# Patient Record
Sex: Female | Born: 1980 | Race: White | Hispanic: No | Smoking: Current every day smoker
Health system: Southern US, Community
[De-identification: ages and names within clinical notes are randomized; demographics above are authoritative.]

## PROBLEM LIST (undated history)

## (undated) DIAGNOSIS — B029 Zoster without complications: Secondary | ICD-10-CM

## (undated) DIAGNOSIS — M779 Enthesopathy, unspecified: Secondary | ICD-10-CM

## (undated) HISTORY — PX: CHOLECYSTECTOMY: SHX55

## (undated) HISTORY — PX: KNEE SURGERY: SHX244

## (undated) HISTORY — PX: MULTIPLE TOOTH EXTRACTIONS: SHX2053

---

## 2002-08-09 ENCOUNTER — Emergency Department (HOSPITAL_COMMUNITY): Admission: EM | Admit: 2002-08-09 | Discharge: 2002-08-09 | Payer: Self-pay

## 2004-12-28 ENCOUNTER — Emergency Department (HOSPITAL_COMMUNITY): Admission: EM | Admit: 2004-12-28 | Discharge: 2004-12-28 | Payer: Self-pay | Admitting: Emergency Medicine

## 2005-05-25 ENCOUNTER — Emergency Department (HOSPITAL_COMMUNITY): Admission: EM | Admit: 2005-05-25 | Discharge: 2005-05-25 | Payer: Self-pay | Admitting: Emergency Medicine

## 2006-10-16 ENCOUNTER — Emergency Department (HOSPITAL_COMMUNITY): Admission: EM | Admit: 2006-10-16 | Discharge: 2006-10-17 | Payer: Self-pay | Admitting: Emergency Medicine

## 2007-03-22 ENCOUNTER — Emergency Department (HOSPITAL_COMMUNITY): Admission: EM | Admit: 2007-03-22 | Discharge: 2007-03-22 | Payer: Self-pay | Admitting: Emergency Medicine

## 2007-03-30 ENCOUNTER — Emergency Department (HOSPITAL_COMMUNITY): Admission: EM | Admit: 2007-03-30 | Discharge: 2007-03-30 | Payer: Self-pay | Admitting: Emergency Medicine

## 2007-05-02 ENCOUNTER — Emergency Department (HOSPITAL_COMMUNITY): Admission: EM | Admit: 2007-05-02 | Discharge: 2007-05-02 | Payer: Self-pay | Admitting: *Deleted

## 2007-07-04 ENCOUNTER — Emergency Department (HOSPITAL_COMMUNITY): Admission: EM | Admit: 2007-07-04 | Discharge: 2007-07-04 | Payer: Self-pay | Admitting: Family Medicine

## 2007-07-06 ENCOUNTER — Emergency Department (HOSPITAL_COMMUNITY): Admission: EM | Admit: 2007-07-06 | Discharge: 2007-07-06 | Payer: Self-pay | Admitting: Emergency Medicine

## 2007-08-02 ENCOUNTER — Emergency Department (HOSPITAL_COMMUNITY): Admission: EM | Admit: 2007-08-02 | Discharge: 2007-08-02 | Payer: Self-pay | Admitting: Emergency Medicine

## 2007-08-15 ENCOUNTER — Emergency Department (HOSPITAL_COMMUNITY): Admission: EM | Admit: 2007-08-15 | Discharge: 2007-08-15 | Payer: Self-pay | Admitting: Emergency Medicine

## 2007-10-08 ENCOUNTER — Emergency Department (HOSPITAL_COMMUNITY): Admission: EM | Admit: 2007-10-08 | Discharge: 2007-10-09 | Payer: Self-pay | Admitting: Emergency Medicine

## 2007-12-22 ENCOUNTER — Emergency Department (HOSPITAL_COMMUNITY): Admission: EM | Admit: 2007-12-22 | Discharge: 2007-12-22 | Payer: Self-pay | Admitting: Family Medicine

## 2008-03-19 ENCOUNTER — Emergency Department (HOSPITAL_COMMUNITY): Admission: EM | Admit: 2008-03-19 | Discharge: 2008-03-19 | Payer: Self-pay | Admitting: Emergency Medicine

## 2008-06-02 ENCOUNTER — Emergency Department (HOSPITAL_COMMUNITY): Admission: EM | Admit: 2008-06-02 | Discharge: 2008-06-02 | Payer: Self-pay | Admitting: Emergency Medicine

## 2008-08-31 ENCOUNTER — Emergency Department (HOSPITAL_COMMUNITY): Admission: EM | Admit: 2008-08-31 | Discharge: 2008-08-31 | Payer: Self-pay | Admitting: Family Medicine

## 2008-10-31 ENCOUNTER — Inpatient Hospital Stay (HOSPITAL_COMMUNITY): Admission: AD | Admit: 2008-10-31 | Discharge: 2008-10-31 | Payer: Self-pay | Admitting: Obstetrics & Gynecology

## 2008-12-11 ENCOUNTER — Inpatient Hospital Stay (HOSPITAL_COMMUNITY): Admission: AD | Admit: 2008-12-11 | Discharge: 2008-12-11 | Payer: Self-pay | Admitting: Obstetrics and Gynecology

## 2008-12-14 ENCOUNTER — Inpatient Hospital Stay (HOSPITAL_COMMUNITY): Admission: AD | Admit: 2008-12-14 | Discharge: 2008-12-15 | Payer: Self-pay | Admitting: Obstetrics and Gynecology

## 2009-01-17 ENCOUNTER — Inpatient Hospital Stay (HOSPITAL_COMMUNITY): Admission: AD | Admit: 2009-01-17 | Discharge: 2009-01-17 | Payer: Self-pay | Admitting: Obstetrics and Gynecology

## 2009-03-04 ENCOUNTER — Inpatient Hospital Stay (HOSPITAL_COMMUNITY): Admission: AD | Admit: 2009-03-04 | Discharge: 2009-03-04 | Payer: Self-pay | Admitting: Obstetrics and Gynecology

## 2009-05-16 ENCOUNTER — Emergency Department (HOSPITAL_COMMUNITY): Admission: EM | Admit: 2009-05-16 | Discharge: 2009-05-16 | Payer: Self-pay | Admitting: Emergency Medicine

## 2009-05-19 ENCOUNTER — Emergency Department (HOSPITAL_COMMUNITY): Admission: EM | Admit: 2009-05-19 | Discharge: 2009-05-19 | Payer: Self-pay | Admitting: Emergency Medicine

## 2009-05-31 ENCOUNTER — Emergency Department (HOSPITAL_COMMUNITY): Admission: EM | Admit: 2009-05-31 | Discharge: 2009-05-31 | Payer: Self-pay | Admitting: Emergency Medicine

## 2009-08-16 ENCOUNTER — Inpatient Hospital Stay (HOSPITAL_COMMUNITY): Admission: AD | Admit: 2009-08-16 | Discharge: 2009-08-16 | Payer: Self-pay | Admitting: Obstetrics and Gynecology

## 2010-09-03 ENCOUNTER — Inpatient Hospital Stay (HOSPITAL_COMMUNITY)
Admission: RE | Admit: 2010-09-03 | Discharge: 2010-09-03 | Disposition: A | Discharge status: Home / Self Care | Payer: Self-pay | Source: Ambulatory Visit | Attending: Family Medicine | Admitting: Family Medicine

## 2010-09-29 LAB — URINALYSIS, ROUTINE W REFLEX MICROSCOPIC
Glucose, UA: NEGATIVE mg/dL
Hgb urine dipstick: NEGATIVE
Ketones, ur: NEGATIVE mg/dL
Protein, ur: NEGATIVE mg/dL
Specific Gravity, Urine: 1.02 (ref 1.005–1.030)

## 2010-09-29 LAB — WET PREP, GENITAL
Clue Cells Wet Prep HPF POC: NONE SEEN
Trich, Wet Prep: NONE SEEN
Yeast Wet Prep HPF POC: NONE SEEN

## 2010-09-29 LAB — CBC
HCT: 33.7 % — ABNORMAL LOW (ref 36.0–46.0)
RDW: 15.1 % (ref 11.5–15.5)
WBC: 7.6 10*3/uL (ref 4.0–10.5)

## 2010-09-29 LAB — GC/CHLAMYDIA PROBE AMP, GENITAL
Chlamydia, DNA Probe: NEGATIVE
GC Probe Amp, Genital: NEGATIVE

## 2010-09-29 LAB — POCT PREGNANCY, URINE: Preg Test, Ur: NEGATIVE

## 2010-10-14 ENCOUNTER — Inpatient Hospital Stay (HOSPITAL_COMMUNITY)
Admission: RE | Admit: 2010-10-14 | Discharge: 2010-10-14 | Disposition: A | Discharge status: Home / Self Care | Payer: Self-pay | Source: Ambulatory Visit | Attending: Obstetrics & Gynecology | Admitting: Obstetrics & Gynecology

## 2010-10-14 DIAGNOSIS — R109 Unspecified abdominal pain: Secondary | ICD-10-CM | POA: Insufficient documentation

## 2010-10-14 DIAGNOSIS — Z3202 Encounter for pregnancy test, result negative: Secondary | ICD-10-CM | POA: Insufficient documentation

## 2010-10-14 LAB — POCT PREGNANCY, URINE: Preg Test, Ur: NEGATIVE

## 2010-10-16 LAB — URINALYSIS, ROUTINE W REFLEX MICROSCOPIC
Nitrite: NEGATIVE
Protein, ur: NEGATIVE mg/dL

## 2010-10-16 LAB — DIFFERENTIAL
Basophils Absolute: 0 10*3/uL (ref 0.0–0.1)
Basophils Relative: 0 % (ref 0–1)
Eosinophils Relative: 2 % (ref 0–5)
Neutrophils Relative %: 56 % (ref 43–77)

## 2010-10-16 LAB — COMPREHENSIVE METABOLIC PANEL
AST: 37 U/L (ref 0–37)
Albumin: 3.9 g/dL (ref 3.5–5.2)
BUN: 10 mg/dL (ref 6–23)
CO2: 25 mEq/L (ref 19–32)
Calcium: 9.4 mg/dL (ref 8.4–10.5)
Creatinine, Ser: 0.7 mg/dL (ref 0.4–1.2)
Glucose, Bld: 87 mg/dL (ref 70–99)
Sodium: 134 mEq/L — ABNORMAL LOW (ref 135–145)
Total Bilirubin: 0.5 mg/dL (ref 0.3–1.2)

## 2010-10-16 LAB — CBC
Hemoglobin: 11.1 g/dL — ABNORMAL LOW (ref 12.0–15.0)
MCHC: 33.2 g/dL (ref 30.0–36.0)
RBC: 3.99 MIL/uL (ref 3.87–5.11)
RDW: 14.1 % (ref 11.5–15.5)
WBC: 7.9 10*3/uL (ref 4.0–10.5)

## 2010-10-16 LAB — URINE CULTURE: Colony Count: 9000

## 2010-10-17 LAB — URINALYSIS, ROUTINE W REFLEX MICROSCOPIC
Bilirubin Urine: NEGATIVE
Glucose, UA: NEGATIVE mg/dL
Ketones, ur: NEGATIVE mg/dL
Leukocytes, UA: NEGATIVE
Nitrite: NEGATIVE
Protein, ur: NEGATIVE mg/dL
Urobilinogen, UA: 0.2 mg/dL (ref 0.0–1.0)

## 2010-10-17 LAB — URINE MICROSCOPIC-ADD ON

## 2010-10-18 LAB — CBC
Hemoglobin: 11.6 g/dL — ABNORMAL LOW (ref 12.0–15.0)
MCHC: 34.5 g/dL (ref 30.0–36.0)
MCV: 87 fL (ref 78.0–100.0)
Platelets: 192 10*3/uL (ref 150–400)
RBC: 3.85 MIL/uL — ABNORMAL LOW (ref 3.87–5.11)
RDW: 15 % (ref 11.5–15.5)

## 2010-10-18 LAB — HCG, QUANTITATIVE, PREGNANCY: hCG, Beta Chain, Quant, S: 4431 m[IU]/mL — ABNORMAL HIGH (ref <5)

## 2010-10-18 LAB — RH IMMUNE GLOBULIN WORKUP (NOT WOMEN'S HOSP): ABO/RH(D): A NEG

## 2010-10-20 LAB — WET PREP, GENITAL
Clue Cells Wet Prep HPF POC: NONE SEEN
Trich, Wet Prep: NONE SEEN

## 2010-10-20 LAB — URINALYSIS, ROUTINE W REFLEX MICROSCOPIC
Glucose, UA: NEGATIVE mg/dL
Nitrite: NEGATIVE
Specific Gravity, Urine: 1.02 (ref 1.005–1.030)
pH: 6 (ref 5.0–8.0)

## 2010-10-20 LAB — HCG, QUANTITATIVE, PREGNANCY: hCG, Beta Chain, Quant, S: 11001 m[IU]/mL — ABNORMAL HIGH (ref <5)

## 2010-10-20 LAB — GC/CHLAMYDIA PROBE AMP, GENITAL
Chlamydia, DNA Probe: NEGATIVE
GC Probe Amp, Genital: NEGATIVE

## 2010-10-20 LAB — CBC
Hemoglobin: 11.9 g/dL — ABNORMAL LOW (ref 12.0–15.0)
RBC: 4.08 MIL/uL (ref 3.87–5.11)

## 2010-10-20 LAB — ABO/RH: ABO/RH(D): A NEG

## 2010-11-29 ENCOUNTER — Inpatient Hospital Stay (INDEPENDENT_AMBULATORY_CARE_PROVIDER_SITE_OTHER)
Admission: RE | Admit: 2010-11-29 | Discharge: 2010-11-29 | Disposition: A | Discharge status: Home / Self Care | Payer: Self-pay | Source: Ambulatory Visit | Attending: Family Medicine | Admitting: Family Medicine

## 2010-11-29 DIAGNOSIS — K047 Periapical abscess without sinus: Secondary | ICD-10-CM

## 2011-01-29 ENCOUNTER — Inpatient Hospital Stay (INDEPENDENT_AMBULATORY_CARE_PROVIDER_SITE_OTHER)
Admission: RE | Admit: 2011-01-29 | Discharge: 2011-01-29 | Disposition: A | Discharge status: Home / Self Care | Payer: Self-pay | Source: Ambulatory Visit | Attending: Family Medicine | Admitting: Family Medicine

## 2011-01-29 DIAGNOSIS — J069 Acute upper respiratory infection, unspecified: Secondary | ICD-10-CM

## 2011-04-04 LAB — CBC
HCT: 39.1
Hemoglobin: 12.6
MCV: 82.6
RBC: 4.74
WBC: 10.1

## 2011-04-04 LAB — URINALYSIS, ROUTINE W REFLEX MICROSCOPIC
Bilirubin Urine: NEGATIVE
Glucose, UA: NEGATIVE
Hgb urine dipstick: NEGATIVE
Protein, ur: NEGATIVE
Specific Gravity, Urine: 1.021
Urobilinogen, UA: 1

## 2011-04-04 LAB — DIFFERENTIAL
Eosinophils Absolute: 0.2
Eosinophils Relative: 2
Lymphocytes Relative: 35
Lymphs Abs: 3.5
Monocytes Absolute: 0.6
Monocytes Relative: 6

## 2011-04-04 LAB — POCT PREGNANCY, URINE
Operator id: 244461
Preg Test, Ur: NEGATIVE

## 2011-04-04 LAB — BASIC METABOLIC PANEL
BUN: 13
Chloride: 106
GFR calc non Af Amer: >60
Potassium: 4.1
Sodium: 137

## 2011-04-07 LAB — POCT URINALYSIS DIP (DEVICE)
Ketones, ur: NEGATIVE
Operator id: 282151
Protein, ur: NEGATIVE
Specific Gravity, Urine: >=1.03
Urobilinogen, UA: 0.2
pH: 6

## 2011-04-29 ENCOUNTER — Emergency Department (HOSPITAL_COMMUNITY)
Admission: EM | Admit: 2011-04-29 | Discharge: 2011-04-29 | Disposition: A | Discharge status: Home / Self Care | Payer: Self-pay | Attending: Emergency Medicine | Admitting: Emergency Medicine

## 2011-04-29 ENCOUNTER — Emergency Department (HOSPITAL_COMMUNITY): Admission: EM | Admit: 2011-04-29 | Payer: Self-pay | Source: Home / Self Care

## 2011-04-29 DIAGNOSIS — K029 Dental caries, unspecified: Secondary | ICD-10-CM | POA: Insufficient documentation

## 2011-04-29 DIAGNOSIS — K047 Periapical abscess without sinus: Secondary | ICD-10-CM | POA: Insufficient documentation

## 2013-03-08 ENCOUNTER — Emergency Department (HOSPITAL_COMMUNITY)
Admission: EM | Admit: 2013-03-08 | Discharge: 2013-03-08 | Disposition: A | Discharge status: Home / Self Care | Payer: BC Managed Care – PPO | Attending: Emergency Medicine | Admitting: Emergency Medicine

## 2013-03-08 ENCOUNTER — Encounter (HOSPITAL_COMMUNITY): Payer: Self-pay | Admitting: Emergency Medicine

## 2013-03-08 DIAGNOSIS — J40 Bronchitis, not specified as acute or chronic: Secondary | ICD-10-CM | POA: Insufficient documentation

## 2013-03-08 DIAGNOSIS — F17200 Nicotine dependence, unspecified, uncomplicated: Secondary | ICD-10-CM | POA: Insufficient documentation

## 2013-03-08 DIAGNOSIS — J3489 Other specified disorders of nose and nasal sinuses: Secondary | ICD-10-CM | POA: Insufficient documentation

## 2013-03-08 DIAGNOSIS — R5381 Other malaise: Secondary | ICD-10-CM | POA: Insufficient documentation

## 2013-03-08 DIAGNOSIS — R509 Fever, unspecified: Secondary | ICD-10-CM | POA: Insufficient documentation

## 2013-03-08 DIAGNOSIS — J029 Acute pharyngitis, unspecified: Secondary | ICD-10-CM | POA: Insufficient documentation

## 2013-03-08 DIAGNOSIS — Z880 Allergy status to penicillin: Secondary | ICD-10-CM | POA: Insufficient documentation

## 2013-03-08 DIAGNOSIS — B349 Viral infection, unspecified: Secondary | ICD-10-CM

## 2013-03-08 DIAGNOSIS — B9789 Other viral agents as the cause of diseases classified elsewhere: Secondary | ICD-10-CM | POA: Insufficient documentation

## 2013-03-08 DIAGNOSIS — IMO0001 Reserved for inherently not codable concepts without codable children: Secondary | ICD-10-CM | POA: Insufficient documentation

## 2013-03-08 MED ORDER — BENZONATATE 100 MG PO CAPS: 100.0000 mg | ORAL_CAPSULE | Freq: Three times a day (TID) | ORAL | Status: DC

## 2013-03-08 MED ORDER — HYDROCOD POLST-CHLORPHEN POLST 10-8 MG/5ML PO LQCR: 5.0000 mL | Freq: Two times a day (BID) | ORAL | Status: DC

## 2013-03-08 NOTE — ED Notes (Signed)
Pt states she began to have a cough and sore throat since last night. Pt also states she has been feeling very warm.  Pt states she has green sputum. Pt states husband has had the same symptoms since yesterday. Denies sob

## 2013-03-08 NOTE — ED Provider Notes (Signed)
CSN: [id number]     Arrival date & time 03/08/13  1216 History   First MD Initiated Contact with Patient 03/08/13 1237     Chief Complaint  Patient presents with  . Cough  . Fever    HPI   Patient presents here with her husband both of the nail for a day or 2. Body aches dry cough nasal congestion. She's had a bit of a sore throat. No abdominal pelvic pain no GI symptoms. History reviewed. No pertinent past medical history. History reviewed. No pertinent past surgical history. History reviewed. No pertinent family history. History  Substance Use Topics  . Smoking status: Current Every Day Smoker  . Smokeless tobacco: Not on file  . Alcohol Use: Yes   OB History   Grav Para Term Preterm Abortions TAB SAB Ect Mult Living                 Review of Systems  Constitutional: Positive for fever and fatigue. Negative for chills, diaphoresis and appetite change.  HENT: Positive for congestion, rhinorrhea and sinus pressure. Negative for sore throat, mouth sores and trouble swallowing.   Eyes: Negative for visual disturbance.  Respiratory: Positive for cough. Negative for chest tightness, shortness of breath and wheezing.   Cardiovascular: Negative for chest pain.  Gastrointestinal: Negative for nausea, vomiting, abdominal pain, diarrhea and abdominal distention.  Endocrine: Negative for polydipsia, polyphagia and polyuria.  Genitourinary: Negative for dysuria, frequency and hematuria.  Musculoskeletal: Positive for myalgias. Negative for gait problem.  Skin: Negative for color change, pallor and rash.  Neurological: Negative for dizziness, syncope, light-headedness and headaches.  Hematological: Does not bruise/bleed easily.  Psychiatric/Behavioral: Negative for behavioral problems and confusion.    Allergies  Codeine and Penicillins  Home Medications   Current Outpatient Rx  Name  Route  Sig  Dispense  Refill  . acetaminophen (TYLENOL) 325 MG tablet   Oral   Take 650 mg by  mouth every 6 (six) hours as needed for pain.         Marland Kitchen aspirin-acetaminophen-caffeine (EXCEDRIN MIGRAINE) 250-250-65 MG per tablet   Oral   Take 2 tablets by mouth every 6 (six) hours as needed (migraine).         Marland Kitchen OVER THE COUNTER MEDICATION   Oral   Take 10 mLs by mouth 2 (two) times daily as needed (cough).         . benzonatate (TESSALON) 100 MG capsule   Oral   Take 1 capsule (100 mg total) by mouth every 8 (eight) hours.   21 capsule   0   . chlorpheniramine-HYDROcodone (TUSSIONEX PENNKINETIC ER) 10-8 MG/5ML LQCR   Oral   Take 5 mLs by mouth every 12 (twelve) hours.   50 mL   0    BP 105/67  Pulse 95  Temp(Src) 98 F (36.7 C) (Oral)  Resp 16  SpO2 99% Physical Exam  Constitutional: She is oriented to person, place, and time. She appears well-developed and well-nourished. No distress.  HENT:  Head: Normocephalic.  Right Ear: No hemotympanum.  Left Ear: No hemotympanum.  Mouth/Throat: No oropharyngeal exudate, posterior oropharyngeal edema, posterior oropharyngeal erythema or tonsillar abscesses.  Eyes: Conjunctivae are normal. Pupils are equal, round, and reactive to light. No scleral icterus.  Neck: Normal range of motion. Neck supple. No thyromegaly present.  Cardiovascular: Normal rate and regular rhythm.  Exam reveals no gallop and no friction rub.   No murmur heard. Pulmonary/Chest: Effort normal and breath sounds normal. No  respiratory distress. She has no decreased breath sounds. She has no wheezes. She has no rhonchi. She has no rales.  Abdominal: Soft. Bowel sounds are normal. She exhibits no distension. There is no tenderness. There is no rebound.  Musculoskeletal: Normal range of motion.  Neurological: She is alert and oriented to person, place, and time.  Skin: Skin is warm and dry. No rash noted.  Psychiatric: She has a normal mood and affect. Her behavior is normal.    ED Course  Procedures (including critical care time) Labs Review Labs  Reviewed - No data to display Imaging Review No results found.  MDM   1. Viral syndrome   2. Bronchitis    Clear lungs.  Not hypoxemic. Normal exam.    Claudean Kinds, MD 03/08/13 1300

## 2017-06-24 ENCOUNTER — Encounter: Payer: Self-pay | Admitting: Emergency Medicine

## 2017-06-24 ENCOUNTER — Emergency Department
Admission: EM | Admit: 2017-06-24 | Discharge: 2017-06-24 | Disposition: A | Discharge status: Home / Self Care | Payer: Self-pay | Attending: Emergency Medicine | Admitting: Emergency Medicine

## 2017-06-24 ENCOUNTER — Emergency Department: Payer: Self-pay

## 2017-06-24 DIAGNOSIS — F17200 Nicotine dependence, unspecified, uncomplicated: Secondary | ICD-10-CM | POA: Insufficient documentation

## 2017-06-24 DIAGNOSIS — Y929 Unspecified place or not applicable: Secondary | ICD-10-CM | POA: Insufficient documentation

## 2017-06-24 DIAGNOSIS — S0181XA Laceration without foreign body of other part of head, initial encounter: Secondary | ICD-10-CM | POA: Insufficient documentation

## 2017-06-24 DIAGNOSIS — W228XXA Striking against or struck by other objects, initial encounter: Secondary | ICD-10-CM | POA: Insufficient documentation

## 2017-06-24 DIAGNOSIS — Z9049 Acquired absence of other specified parts of digestive tract: Secondary | ICD-10-CM | POA: Insufficient documentation

## 2017-06-24 DIAGNOSIS — Y990 Civilian activity done for income or pay: Secondary | ICD-10-CM | POA: Insufficient documentation

## 2017-06-24 DIAGNOSIS — Y9389 Activity, other specified: Secondary | ICD-10-CM | POA: Insufficient documentation

## 2017-06-24 DIAGNOSIS — S0990XA Unspecified injury of head, initial encounter: Secondary | ICD-10-CM | POA: Insufficient documentation

## 2017-06-24 LAB — POCT PREGNANCY, URINE: PREG TEST UR: NEGATIVE

## 2017-06-24 MED ORDER — MELOXICAM 15 MG PO TABS: 15.0000 mg | ORAL_TABLET | Freq: Every day | ORAL | 0 refills | Status: DC

## 2017-06-24 NOTE — ED Notes (Signed)

## 2017-06-24 NOTE — ED Triage Notes (Signed)
Pt. is w/c but first nurse verified with place of employment that no testing is required.

## 2017-06-24 NOTE — ED Notes (Signed)
Spoke with [employer] Manager, [employer]ica Aziles [phone]) and no UDS  Required.

## 2017-06-24 NOTE — ED Provider Notes (Signed)
[facility] Emergency Department Provider Note  ____________________________________________   First MD Initiated Contact with Patient 06/24/17 2037     (approximate)  I have reviewed the triage vital signs and the nursing notes.   HISTORY  Chief Complaint Head Laceration    HPI Krystal Curry is a 36 y.o. female injury to her head, she states a metal pipe hit her in the head while she was hanging clothes on it, she knows that her head was bleeding and freaked out, her coworker states that she could not answer questions correctly and seemed to be scrambling her words a little bit directly after the incident, now she has a headache, the area of has stopped bleeding, she denies vomiting; her boyfriend states that he feels like she is been acting normal since they arrived in the ER  History reviewed. No pertinent past medical history.  There are no active problems to display for this patient.   Past Surgical History:  Procedure Laterality Date  . CHOLECYSTECTOMY    . KNEE SURGERY Left   . MULTIPLE TOOTH EXTRACTIONS Bilateral     Prior to Admission medications   Medication Sig Start Date End Date Taking? Authorizing Provider  meloxicam (MOBIC) 15 MG tablet Take 1 tablet (15 mg total) by mouth daily. 06/24/17 06/24/18  Faythe GheeFisher, Susan W, PA-C    Allergies Penicillins and Prednisone  No family history on file.  Social History Social History   Tobacco Use  . Smoking status: Current Every Day Smoker  . Smokeless tobacco: Never Used  Substance Use Topics  . Alcohol use: No    Frequency: Never  . Drug use: No    Review of Systems  Constitutional: No fever/chills, positive head injury Eyes: No visual changes. ENT: No sore throat. Respiratory: Denies cough Genitourinary: Negative for dysuria. Musculoskeletal: Negative for back pain. Skin: Positive for laceration.    ____________________________________________   PHYSICAL EXAM:  VITAL  SIGNS: ED Triage Vitals [06/24/17 1957]  Enc Vitals Group     BP 130/81     Pulse Rate 87     Resp 16     Temp 98.8 F (37.1 C)     Temp Source Oral     SpO2 99 %     Weight      Height      Head Circumference      Peak Flow      Pain Score 7     Pain Loc      Pain Edu?      Excl. in GC?     Constitutional: Alert and oriented. Well appearing and in no acute distress. Eyes: Conjunctivae are normal.  Pupils are large bilaterally Head: A half a centimeter laceration to the area above the right cheek, the area has no active bleeding, it appears to be more of an abrasion than a large laceration, the area is tender to palpation Nose: No congestion/rhinnorhea. Mouth/Throat: Mucous membranes are moist.   Cardiovascular: Normal rate, regular rhythm.  Heart sounds are normal Respiratory: Normal respiratory effort.  No retractions, lungs are clear to auscultation GU: deferred Musculoskeletal: FROM all extremities, warm and well perfused Neurologic:  Normal speech and language.  Skin:  Skin is warm, dry and intact. No rash noted. Psychiatric: Mood and affect are normal. Speech and behavior are normal.  She is laughing and joking with her boyfriend in the room  ____________________________________________   LABS (all labs ordered are listed, but only abnormal results are displayed)  Labs Reviewed  POC URINE PREG, ED  POCT PREGNANCY, URINE   ____________________________________________   ____________________________________________  RADIOLOGY  Ct head is negative  ____________________________________________   PROCEDURES  Procedure(s) performed: No      ____________________________________________   INITIAL IMPRESSION / ASSESSMENT AND PLAN / ED COURSE  Pertinent labs & imaging results that were available during my care of the patient were reviewed by me and considered in my medical decision making (see chart for details).  36 year old female is here because she  got hit in the head with a metal pipe at work, CT of the head is negative for laceration to the right side of the eye has already begun to heal there is no active bleeding and the wound is well approximated, she was discharged in stable condition with minor head injury instructions and wound care for the wound, she is to return to work tomorrow, workers comp did not require a drug screen, her return to work papers were given to her, she was discharged in stable condition      ____________________________________________   FINAL CLINICAL IMPRESSION(S) / ED DIAGNOSES  Final diagnoses:  Laceration of forehead, initial encounter  Minor head injury, initial encounter      NEW MEDICATIONS STARTED DURING THIS VISIT:  This SmartLink is deprecated. Use AVSMEDLIST instead to display the medication list for a patient.   Note:  This document was prepared using Dragon voice recognition software and may include unintentional dictation errors.    Faythe GheeFisher, Susan W, PA-C 06/24/17 2208    Jeanmarie PlantMcShane, James A, MD 06/24/17 [phone]

## 2017-06-24 NOTE — ED Notes (Signed)
Pt states she hit the right side of her right eye near eyebrow at approx 6:20pm at work. Will be filing worker's comp. Works at [employer]. Pt states she has a 7/10 headache. Small 1/2" laceration that appears closed at this time.

## 2017-06-24 NOTE — ED Notes (Signed)
Patient to stat desk via wheelchair by EMS.  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  EMS reports no loss of consciousness.  Patient with laceration at right eye brow.

## 2017-06-24 NOTE — Discharge Instructions (Signed)
Apply ice to the area that hurts, apply Neosporin to the abrasion on her face, if you are worsening with the headache or have vomiting please return to the emergency department, you may take the meloxicam as needed for pain

## 2017-06-24 NOTE — ED Notes (Signed)
Pt back in room s/p CT

## 2017-06-24 NOTE — ED Notes (Signed)
POC Preg: NEGATIVE 

## 2017-06-24 NOTE — ED Triage Notes (Signed)
Patient was at work tonight and a Engineer, maintenance (IT)hanger rack fell over and she went to pick it up and part if swung around and hit her in the right temple, causing a ~1cm laceration just above her right orbital socket.  Bleeding is controlled at this time, the nurse hotline at work she talked to said she needed to come in to be seen, the nurse was concerned because patient was having memory problems.

## 2017-06-26 ENCOUNTER — Encounter (HOSPITAL_COMMUNITY): Payer: Self-pay | Admitting: Emergency Medicine

## 2017-08-29 ENCOUNTER — Other Ambulatory Visit: Payer: Self-pay

## 2017-08-29 ENCOUNTER — Encounter: Payer: Self-pay | Admitting: Emergency Medicine

## 2017-08-29 ENCOUNTER — Emergency Department
Admission: EM | Admit: 2017-08-29 | Discharge: 2017-08-29 | Disposition: A | Discharge status: Home / Self Care | Payer: Self-pay | Attending: Emergency Medicine | Admitting: Emergency Medicine

## 2017-08-29 DIAGNOSIS — H9201 Otalgia, right ear: Secondary | ICD-10-CM | POA: Insufficient documentation

## 2017-08-29 DIAGNOSIS — F17200 Nicotine dependence, unspecified, uncomplicated: Secondary | ICD-10-CM | POA: Insufficient documentation

## 2017-08-29 DIAGNOSIS — K0889 Other specified disorders of teeth and supporting structures: Secondary | ICD-10-CM | POA: Insufficient documentation

## 2017-08-29 MED ORDER — SULFAMETHOXAZOLE-TRIMETHOPRIM 800-160 MG PO TABS: 1.0000 | ORAL_TABLET | Freq: Two times a day (BID) | ORAL | 0 refills | Status: DC

## 2017-08-29 MED ORDER — NAPROXEN 500 MG PO TABS: 500.0000 mg | ORAL_TABLET | Freq: Two times a day (BID) | ORAL | 0 refills | Status: DC

## 2017-08-29 MED ORDER — LIDOCAINE-EPINEPHRINE 2 %-1:100000 IJ SOLN
1.7000 mL | Freq: Once | INTRAMUSCULAR | Status: AC
  Administered 2017-08-29: 1.7 mL
  Filled 2017-08-29: qty 1.7

## 2017-08-29 NOTE — ED Notes (Signed)
Pt reports toothache for the past 2 weeks. Tooth hurts to right lower jaw and pt reports this week the pain has started moving up her entire right side of her face. Pt reports has taken "every OTC medication you can think of with no relief". Pt states took, ibuprofen, tylenol, orajel, etc. Pt states now her ear on the right side is hurting as well. Pt reports does not have a dentist.

## 2017-08-29 NOTE — ED Provider Notes (Signed)
[facility] Emergency Department Provider Note ____________________________________________  Time seen: Approximately 9:02 AM  I have reviewed the triage vital signs and the nursing notes.   HISTORY  Chief Complaint Dental Pain and Otalgia   HPI Krystal Curry is a 37 y.o. female who presents to the emergency department for treatment and evaluation of dental pain that is now radiating up into her right ear.  She states that she has taken over-the-counter medications without any relief.  Symptoms started approximately 1 week ago.  History reviewed. No pertinent past medical history.  There are no active problems to display for this patient.   Past Surgical History:  Procedure Laterality Date  . CHOLECYSTECTOMY    . KNEE SURGERY Left   . MULTIPLE TOOTH EXTRACTIONS Bilateral     Prior to Admission medications   Medication Sig Start Date End Date Taking? Authorizing Provider  acetaminophen (TYLENOL) 325 MG tablet Take 650 mg by mouth every 6 (six) hours as needed for pain.    [provider]  aspirin-acetaminophen-caffeine (EXCEDRIN MIGRAINE) 289 054 4336250-250-65 MG per tablet Take 2 tablets by mouth every 6 (six) hours as needed (migraine).    [provider]  benzonatate (TESSALON) 100 MG capsule Take 1 capsule (100 mg total) by mouth every 8 (eight) hours. 03/08/13   Rolland PorterJames, Mark, MD  chlorpheniramine-HYDROcodone [facility](TUSSIONEX PENNKINETIC ER) 10-8 MG/5ML LQCR Take 5 mLs by mouth every 12 (twelve) hours. 03/08/13   Rolland PorterJames, Mark, MD  meloxicam (MOBIC) 15 MG tablet Take 1 tablet (15 mg total) by mouth daily. 06/24/17 06/24/18  Sherrie MustacheFisher, Roselyn BeringSusan W, PA-C  naproxen (NAPROSYN) 500 MG tablet Take 1 tablet (500 mg total) by mouth 2 (two) times daily with a meal. 08/29/17   Lola Lofaro B, FNP  OVER THE COUNTER MEDICATION Take 10 mLs by mouth 2 (two) times daily as needed (cough).    [provider]  sulfamethoxazole-trimethoprim (BACTRIM DS,SEPTRA DS)  800-160 MG tablet Take 1 tablet by mouth 2 (two) times daily. 08/29/17   Zakiah Gauthreaux, Rulon Eisenmengerari B, FNP    Allergies Codeine; Penicillins; Tizanidine; Penicillins; and Prednisone  History reviewed. No pertinent family history.  Social History Social History   Tobacco Use  . Smoking status: Current Every Day Smoker  . Smokeless tobacco: Never Used  Substance Use Topics  . Alcohol use: No    Frequency: Never  . Drug use: No    Review of Systems Constitutional: Negative for fever ENT: Positive for dental pain. Musculoskeletal: Negative for trismus  Skin: Negative for erythema or edema ____________________________________________   PHYSICAL EXAM:  VITAL SIGNS: ED Triage Vitals  Enc Vitals Group     BP 08/29/17 0845 (!) 146/65     Pulse Rate 08/29/17 0845 99     Resp 08/29/17 0845 20     Temp 08/29/17 0845 99.4 F (37.4 C)     Temp Source 08/29/17 0845 Oral     SpO2 08/29/17 0845 100 %     Weight 08/29/17 0846 133 lb (60.3 kg)     Height 08/29/17 0846 5\' 1"  (1.549 m)     Head Circumference --      Peak Flow --      Pain Score 08/29/17 0846 9     Pain Loc --      Pain Edu? --      Excl. in GC? --     Constitutional: Alert and oriented. Well appearing and in no acute distress. Eyes: Clear without discharge or drainage. Mouth/Throat: See periodontal exam  Periodontal Exam    Hematological/Lymphatic/Immunilogical: No palpable anterior cervical lymph nodes. Respiratory: Respirations are even and unlabored. Musculoskeletal: No trismus Neurologic: No paresthesias.  Skin:  No facial or sublingual induration.  Psychiatric: Affect and behavior are normal.  ____________________________________________   LABS (all labs ordered are listed, but only abnormal results are displayed)  Labs Reviewed - No data to display ____________________________________________   RADIOLOGY  Not indicated. ____________________________________________   PROCEDURES  Procedure(s)  performed:  Dental block performed by me after the patient gave verbal permission. 27 gauge needle and Lidocaine 1% with epinephrine 1.7 ML injected into the pterygomandibular space with successful anesthesia of the teeth, lip, and 1/2 of tongue. Patient tolerated the procedure well without immediate complications.   Critical Care performed: No ____________________________________________   INITIAL IMPRESSION / ASSESSMENT AND PLAN / ED COURSE  Krystal Curry is a 37 y.o. female who presents to the emergency department for treatment and evaluation of dental pain. Dental block performed with good results. She is to go to the dental clinic at [facility] today. She is to return to the ER for symptoms that change or worsen or for new concerns if unable to schedule an appointment.  Pertinent labs & imaging results that were available during my care of the patient were reviewed by me and considered in my medical decision making (see chart for details).  ____________________________________________   FINAL CLINICAL IMPRESSION(S) / ED DIAGNOSES  Final diagnoses:  Pain, dental    Discharge Medication List as of 08/29/2017  9:44 AM    START taking these medications   Details  naproxen (NAPROSYN) 500 MG tablet Take 1 tablet (500 mg total) by mouth 2 (two) times daily with a meal., Starting Tue 08/29/2017, Print    sulfamethoxazole-trimethoprim (BACTRIM DS,SEPTRA DS) 800-160 MG tablet Take 1 tablet by mouth 2 (two) times daily., Starting Tue 08/29/2017, Print        If controlled substance prescribed during this visit, 12 month history viewed on the [facility] prior to issuing an initial prescription for Schedule II or III opiod.  Note:  This document was prepared using Dragon voice recognition software and may include unintentional dictation errors.    Krystal Pester, FNP 08/29/17 1205    Krystal Filbert, MD 08/29/17 1225

## 2017-08-29 NOTE — ED Notes (Signed)
First Nurse Note:  Patient complaining of tooth pain, right lower jaw and right ear.  Pain X 2 weeks and now "it's in my ear".

## 2017-08-29 NOTE — ED Triage Notes (Signed)
Pt to ed with c/o right side earache and toothache x 1 week.

## 2017-09-03 ENCOUNTER — Encounter: Payer: Self-pay | Admitting: Emergency Medicine

## 2017-09-03 ENCOUNTER — Emergency Department
Admission: EM | Admit: 2017-09-03 | Discharge: 2017-09-03 | Disposition: A | Discharge status: Home / Self Care | Payer: Self-pay | Attending: Emergency Medicine | Admitting: Emergency Medicine

## 2017-09-03 ENCOUNTER — Other Ambulatory Visit: Payer: Self-pay

## 2017-09-03 DIAGNOSIS — M5432 Sciatica, left side: Secondary | ICD-10-CM

## 2017-09-03 DIAGNOSIS — Z79899 Other long term (current) drug therapy: Secondary | ICD-10-CM | POA: Insufficient documentation

## 2017-09-03 DIAGNOSIS — M5442 Lumbago with sciatica, left side: Secondary | ICD-10-CM | POA: Insufficient documentation

## 2017-09-03 DIAGNOSIS — F17210 Nicotine dependence, cigarettes, uncomplicated: Secondary | ICD-10-CM | POA: Insufficient documentation

## 2017-09-03 MED ORDER — KETOROLAC TROMETHAMINE 10 MG PO TABS: 10.0000 mg | ORAL_TABLET | Freq: Three times a day (TID) | ORAL | 0 refills | Status: DC

## 2017-09-03 MED ORDER — KETOROLAC TROMETHAMINE 30 MG/ML IJ SOLN
30.0000 mg | Freq: Once | INTRAMUSCULAR | Status: AC
  Administered 2017-09-03: 30 mg via INTRAMUSCULAR
  Filled 2017-09-03: qty 1

## 2017-09-03 NOTE — ED Notes (Signed)
Patient declined discharge vital signs. 

## 2017-09-03 NOTE — Discharge Instructions (Signed)
Your exam is consistent with a sciatic nerve irritation. Take the prescription anti-inflammatory in the place of your Naproxen, for the next 5 days. You may take it with your previously prescribed muscle relaxant. Apply ice or moist heat therapy to reduce pain. See your provider at [facility], as scheduled.

## 2017-09-03 NOTE — ED Triage Notes (Signed)
Here for right lower back pain radiating down right leg X 2 days. No loss bowel or bladder.  "it feels like my nerve is pinched".  Ambulatory. VSS

## 2017-09-03 NOTE — ED Provider Notes (Signed)
[facility] Emergency Department Provider Note ____________________________________________  Time seen: 1635  I have reviewed the triage vital signs and the nursing notes.  HISTORY  Chief Complaint  Sciatica  HPI Krystal Curry is a 37 y.o. female presents to the ED accompanied by her husband, for evaluation of right low back pain with referral to the right buttocks and thigh.  Patient gives a remote history of sciatica nerve irritation.  She reports for the last 2 days she has had increased pain and discomfort in the right-sided low back.  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  She also notes that pain is increased with sitting on her right buttocks.  She denies any foot drop, incontinence, or recent injuries.  She is currently taking amoxicillin and naproxen for dental infection.  She also has a prescription for baclofen and gabapentin.  History reviewed. No pertinent past medical history.  There are no active problems to display for this patient.   Past Surgical History:  Procedure Laterality Date  . CHOLECYSTECTOMY    . KNEE SURGERY Left   . MULTIPLE TOOTH EXTRACTIONS Bilateral     Prior to Admission medications   Medication Sig Start Date End Date Taking? Authorizing Provider  acetaminophen (TYLENOL) 325 MG tablet Take 650 mg by mouth every 6 (six) hours as needed for pain.    [provider]  aspirin-acetaminophen-caffeine (EXCEDRIN MIGRAINE) [phone] MG per tablet Take 2 tablets by mouth every 6 (six) hours as needed (migraine).    [provider]  benzonatate (TESSALON) 100 MG capsule Take 1 capsule (100 mg total) by mouth every 8 (eight) hours. 03/08/13   Rolland Porter, MD  chlorpheniramine-HYDROcodone [facility]) 10-8 MG/5ML LQCR Take 5 mLs by mouth every 12 (twelve) hours. 03/08/13   Rolland Porter, MD  ketorolac (TORADOL) 10 MG tablet Take 1 tablet (10 mg total) by mouth  every 8 (eight) hours. 09/03/17   Imre Vecchione, Charlesetta Ivory, PA-C  meloxicam (MOBIC) 15 MG tablet Take 1 tablet (15 mg total) by mouth daily. 06/24/17 06/24/18  Sherrie Mustache Roselyn Bering, PA-C  naproxen (NAPROSYN) 500 MG tablet Take 1 tablet (500 mg total) by mouth 2 (two) times daily with a meal. 08/29/17   Triplett, Cari B, FNP  OVER THE COUNTER MEDICATION Take 10 mLs by mouth 2 (two) times daily as needed (cough).    [provider]  sulfamethoxazole-trimethoprim (BACTRIM DS,SEPTRA DS) 800-160 MG tablet Take 1 tablet by mouth 2 (two) times daily. 08/29/17   Triplett, Rulon Eisenmenger B, FNP    Allergies Codeine; Penicillins; Tizanidine; Penicillins; and Prednisone  History reviewed. No pertinent family history.  Social History Social History   Tobacco Use  . Smoking status: Current Every Day Smoker  . Smokeless tobacco: Never Used  Substance Use Topics  . Alcohol use: No    Frequency: Never  . Drug use: No    Review of Systems  Constitutional: Negative for fever. Cardiovascular: Negative for chest pain. Respiratory: Negative for shortness of breath. Gastrointestinal: Negative for abdominal pain, vomiting and diarrhea. Genitourinary: Negative for dysuria. Musculoskeletal: Positive for back pain. Skin: Negative for rash. Neurological: Negative for headaches, focal weakness or numbness. ____________________________________________  PHYSICAL EXAM:  VITAL SIGNS: ED Triage Vitals  Enc Vitals Group     BP 09/03/17 1514 127/81     Pulse Rate 09/03/17 1514 93     Resp 09/03/17 1514 16     Temp 09/03/17 1514 98.3 F (36.8 C)  Temp Source 09/03/17 1514 Oral     SpO2 09/03/17 1514 98 %     Weight 09/03/17 1512 130 lb (59 kg)     Height 09/03/17 1512 5\' 1"  (1.549 m)     Head Circumference --      Peak Flow --      Pain Score 09/03/17 1512 9     Pain Loc --      Pain Edu? --      Excl. in GC? --     Constitutional: Alert and oriented. Well appearing and in no distress. Head:  Normocephalic and atraumatic. Eyes: Conjunctivae are normal. Normal extraocular movements Cardiovascular: Normal rate, regular rhythm. Normal distal pulses. Respiratory: Normal respiratory effort. No wheezes/rales/rhonchi. Gastrointestinal: Soft and nontender. No distention. Musculoskeletal: Normal spinal alignment without midline tenderness, spasm, deformity, or step-off.  Patient is mildly tender to palpation over the right SI region.  Is able to demonstrate a normal seated straight leg raise.  She transitions from sit to stand without assistance.  She is slow to extend fully at the lumbar spine.  Nontender with normal range of motion in all extremities.  Neurologic: Nerves II through XII grossly intact.  Normal LE DTRs bilaterally.  Normal toe dorsiflexion and foot eversion on exam.  Normal gait without ataxia. Normal speech and language. No gross focal neurologic deficits are appreciated. Skin:  Skin is warm, dry and intact. No rash noted. Psychiatric: Mood and affect are normal. Patient exhibits appropriate insight and judgment. ____________________________________________  PROCEDURES  Procedures Toradol 30 mg IM ____________________________________________  INITIAL IMPRESSION / ASSESSMENT AND PLAN / ED COURSE  Patient with ED evaluation of a flare of her right sciatic nerve irritation.  Patient with a benign exam without any acute neuromuscular deficit.  She is discharged with a prescription for ketorolac which will replace her naproxen for about 5 days.  She will dose her previously prescribed baclofen as needed.  She will see her newly established primary care provider on March 1 for further evaluation and management.  Return precautions have been reviewed. ____________________________________________  FINAL CLINICAL IMPRESSION(S) / ED DIAGNOSES  Final diagnoses:  Sciatica of left side      Karmen StabsMenshew, Charlesetta IvoryJenise V Bacon, PA-C 09/03/17 1820    Dionne BucySiadecki, Sebastian, MD 09/03/17  2255

## 2017-09-06 ENCOUNTER — Encounter: Payer: Self-pay | Admitting: Emergency Medicine

## 2017-09-06 ENCOUNTER — Emergency Department
Admission: EM | Admit: 2017-09-06 | Discharge: 2017-09-06 | Disposition: A | Discharge status: Home / Self Care | Payer: Self-pay | Attending: Emergency Medicine | Admitting: Emergency Medicine

## 2017-09-06 DIAGNOSIS — F17210 Nicotine dependence, cigarettes, uncomplicated: Secondary | ICD-10-CM | POA: Insufficient documentation

## 2017-09-06 DIAGNOSIS — M5442 Lumbago with sciatica, left side: Secondary | ICD-10-CM | POA: Insufficient documentation

## 2017-09-06 MED ORDER — METHYLPREDNISOLONE 4 MG PO TABS: ORAL_TABLET | ORAL | 0 refills | Status: DC

## 2017-09-06 NOTE — ED Provider Notes (Signed)
[facility] Emergency Department Provider Note   ____________________________________________   First MD Initiated Contact with Patient 09/06/17 1117     (approximate)  I have reviewed the triage vital signs and the nursing notes.   HISTORY  Chief Complaint Sciatica   HPI Krystal Curry is a 37 y.o. female is here with complaint of left-sided back pain that radiates down her left leg.  Patient states she was seen here Sunday for the same.  She states the medication that she was prescribed is not helping with the pain.  Patient [facility].  Currently she rates her pain as 9/10. Patient was prescribed Toradol 10 mg 1 every 8 hours which she states is not helping.  History reviewed. No pertinent past medical history.  There are no active problems to display for this patient.   Past Surgical History:  Procedure Laterality Date  . CHOLECYSTECTOMY    . KNEE SURGERY Left   . MULTIPLE TOOTH EXTRACTIONS Bilateral     Prior to Admission medications   Medication Sig Start Date End Date Taking? Authorizing Provider  acetaminophen (TYLENOL) 325 MG tablet Take 650 mg by mouth every 6 (six) hours as needed for pain.    [provider]  aspirin-acetaminophen-caffeine (EXCEDRIN MIGRAINE) [phone] MG per tablet Take 2 tablets by mouth every 6 (six) hours as needed (migraine).    [provider]  methylPREDNISolone (MEDROL) 4 MG tablet Take 1 tablet once a day, discontinue taking if any urges of anger 09/06/17   Bridget HartshornSummers, Kooper Chriswell L, PA-C  OVER THE COUNTER MEDICATION Take 10 mLs by mouth 2 (two) times daily as needed (cough).    [provider]    Allergies Codeine; Penicillins; Tizanidine; Penicillins; and Prednisone  No family history on file.  Social History Social History   Tobacco Use  . Smoking status: Current Every Day Smoker  . Smokeless tobacco: Never Used  Substance  Use Topics  . Alcohol use: No    Frequency: Never  . Drug use: No    Review of Systems Constitutional: No fever/chills Cardiovascular: Denies chest pain. Respiratory: Denies shortness of breath. Gastrointestinal:   No nausea, no vomiting.  Musculoskeletal: Positive for left lower back pain with radiation into the left leg. Skin: Negative for rash. Neurological: Negative for headaches, focal weakness or numbness. ____________________________________________   PHYSICAL EXAM:  VITAL SIGNS: ED Triage Vitals  Enc Vitals Group     BP 09/06/17 1057 (!) 141/80     Pulse Rate 09/06/17 1057 97     Resp 09/06/17 1057 17     Temp 09/06/17 1057 97.7 F (36.5 C)     Temp Source 09/06/17 1057 Oral     SpO2 09/06/17 1057 100 %     Weight --      Height --      Head Circumference --      Peak Flow --      Pain Score 09/06/17 1101 9     Pain Loc --      Pain Edu? --      Excl. in GC? --    Constitutional: Alert and oriented. Well appearing and in no acute distress.  Patient is currently lying on her stomach on the stretcher as she states that increases her pain to sit on her buttocks. Eyes: Conjunctivae are normal.  Head: Atraumatic. Nose: No congestion/rhinnorhea. Neck: No stridor.   Cardiovascular: Normal rate, regular rhythm.  Grossly normal heart sounds.  Good peripheral circulation. Respiratory: Normal respiratory effort.  No retractions. Lungs CTAB. Musculoskeletal: No gross deformity is noted.  Good muscle strength bilaterally.  Straight leg raises were not tolerated due to pain.  Moderate tenderness on palpation of the L5-S1 area and and left SI joint area. Neurologic:  Normal speech and language. No gross focal neurologic deficits are appreciated.  Reflexes 1+ bilaterally.  No gait instability. Skin:  Skin is warm, dry and intact. No rash noted. Psychiatric: Mood and affect are normal. Speech and behavior are normal.  ____________________________________________   LABS (all  labs ordered are listed, but only abnormal results are displayed)  Labs Reviewed - No data to display   PROCEDURES  Procedure(s) performed: None  Procedures  Critical Care performed: No  ____________________________________________   INITIAL IMPRESSION / ASSESSMENT AND PLAN / ED COURSE Patient was made aware that once again this is most likely sciatica.  In talking with her she is listed prednisone as being an allergy only because it made her severely mad.  She states it was a very large white pill which sounds like 60 mg.  Patient is willing to try methylprednisolone 4 mg once daily for the next 5 days.  She has an appointment on March 1 with a new provider at [facility].  She is encouraged to keep this appointment.  ____________________________________________   FINAL CLINICAL IMPRESSION(S) / ED DIAGNOSES  Final diagnoses:  Acute left-sided low back pain with left-sided sciatica     ED Discharge Orders        Ordered    methylPREDNISolone (MEDROL) 4 MG tablet     09/06/17 1211       Note:  This document was prepared using Dragon voice recognition software and may include unintentional dictation errors.    Tommi Rumps, PA-C 09/06/17 1406    Emily Filbert, MD 09/06/17 [phone]

## 2017-09-06 NOTE — ED Triage Notes (Signed)
Patient presents to ED via POV from work with c/o left sided back pain that radiates down her left leg. Patient was seen here Sunday for the same. Patient reports pain medication is not helping and that the pain is getting worse. Patient crying in triage.

## 2017-09-06 NOTE — Discharge Instructions (Signed)
Keep your appointment with your doctor on March 1.  Begin taking Medrol 1 tablet/day.  You may take Tylenol with this medication if needed.  Take this medication to your doctor at the time of your visit.

## 2017-09-06 NOTE — ED Notes (Signed)
Pt ambulatory without difficulty. VSS. NAD. Discharge instructions, RX, follow up discussed. All questions answered.

## 2017-09-06 NOTE — ED Notes (Signed)
First Nurse Note:  Patient returns with left leg pain, seen here Sun with same complaint.

## 2017-09-21 ENCOUNTER — Emergency Department: Payer: Self-pay

## 2017-09-21 ENCOUNTER — Emergency Department
Admission: EM | Admit: 2017-09-21 | Discharge: 2017-09-21 | Disposition: A | Discharge status: Home / Self Care | Payer: Self-pay | Attending: Emergency Medicine | Admitting: Emergency Medicine

## 2017-09-21 ENCOUNTER — Other Ambulatory Visit: Payer: Self-pay

## 2017-09-21 DIAGNOSIS — M5410 Radiculopathy, site unspecified: Secondary | ICD-10-CM

## 2017-09-21 DIAGNOSIS — Z79899 Other long term (current) drug therapy: Secondary | ICD-10-CM | POA: Insufficient documentation

## 2017-09-21 DIAGNOSIS — F17200 Nicotine dependence, unspecified, uncomplicated: Secondary | ICD-10-CM | POA: Insufficient documentation

## 2017-09-21 DIAGNOSIS — M545 Low back pain: Secondary | ICD-10-CM | POA: Insufficient documentation

## 2017-09-21 LAB — URINALYSIS, COMPLETE (UACMP) WITH MICROSCOPIC
BILIRUBIN URINE: NEGATIVE
Bacteria, UA: NONE SEEN
Glucose, UA: NEGATIVE mg/dL
Ketones, ur: NEGATIVE mg/dL
Leukocytes, UA: NEGATIVE
Nitrite: NEGATIVE
PH: 5 (ref 5.0–8.0)
Protein, ur: NEGATIVE mg/dL
SPECIFIC GRAVITY, URINE: 1.014 (ref 1.005–1.030)

## 2017-09-21 LAB — POCT PREGNANCY, URINE: Preg Test, Ur: NEGATIVE

## 2017-09-21 MED ORDER — TRAMADOL HCL 50 MG PO TABS: 50.0000 mg | ORAL_TABLET | Freq: Four times a day (QID) | ORAL | 0 refills | Status: DC | PRN

## 2017-09-21 MED ORDER — CYCLOBENZAPRINE HCL 10 MG PO TABS: 10.0000 mg | ORAL_TABLET | Freq: Three times a day (TID) | ORAL | 0 refills | Status: DC | PRN

## 2017-09-21 MED ORDER — METHYLPREDNISOLONE 4 MG PO TBPK: ORAL_TABLET | ORAL | 0 refills | Status: DC

## 2017-09-21 NOTE — ED Provider Notes (Signed)
[facility] Emergency Department Provider Note   ____________________________________________   First MD Initiated Contact with Patient 09/21/17 1129     (approximate)  I have reviewed the triage vital signs and the nursing notes.   HISTORY  Chief Complaint Back Pain    HPI Krystal Curry is a 37 y.o. female patient complained of radicular back pain that is increased in the past 6 weeks.  Patient third ER visit for same complaint this month.  Patient states she has a history of sciatica but has not been treated or followed up in over 4 years.  Patient complain of bilateral numbness to the lower extremities.  Patient denies bladder bowel dysfunction.  Patient state no relief with steroidal and anti-inflammatory medications.  Patient was sent home from work secondary to unable to stand upright without discomfort.  History reviewed. No pertinent past medical history.  There are no active problems to display for this patient.   Past Surgical History:  Procedure Laterality Date  . CHOLECYSTECTOMY    . KNEE SURGERY Left   . MULTIPLE TOOTH EXTRACTIONS Bilateral     Prior to Admission medications   Medication Sig Start Date End Date Taking? Authorizing Provider  acetaminophen (TYLENOL) 325 MG tablet Take 650 mg by mouth every 6 (six) hours as needed for pain.    [provider]  aspirin-acetaminophen-caffeine (EXCEDRIN MIGRAINE) [phone] MG per tablet Take 2 tablets by mouth every 6 (six) hours as needed (migraine).    [provider]  cyclobenzaprine (FLEXERIL) 10 MG tablet Take 1 tablet (10 mg total) by mouth 3 (three) times daily as needed. 09/21/17   Joni Reining, PA-C  methylPREDNISolone (MEDROL DOSEPAK) 4 MG TBPK tablet Take Tapered dose as directed 09/21/17   Joni Reining, PA-C  methylPREDNISolone (MEDROL) 4 MG tablet Take 1 tablet once a day, discontinue taking if any urges of anger 09/06/17   Bridget Hartshorn L, PA-C  OVER THE  COUNTER MEDICATION Take 10 mLs by mouth 2 (two) times daily as needed (cough).    [provider]  traMADol (ULTRAM) 50 MG tablet Take 1 tablet (50 mg total) by mouth every 6 (six) hours as needed. 09/21/17 09/21/18  Joni Reining, PA-C    Allergies Codeine; Penicillins; Tizanidine; Penicillins; and Prednisone  No family history on file.  Social History Social History   Tobacco Use  . Smoking status: Current Every Day Smoker  . Smokeless tobacco: Never Used  Substance Use Topics  . Alcohol use: No    Frequency: Never  . Drug use: No    Review of Systems Constitutional: No fever/chills Eyes: No visual changes. ENT: No sore throat. Cardiovascular: Denies chest pain. Respiratory: Denies shortness of breath. Gastrointestinal: No abdominal pain.  No nausea, no vomiting.  No diarrhea.  No constipation. Genitourinary: Negative for dysuria. Musculoskeletal: Positive for back pain. Skin: Negative for rash. Neurological: Negative for headaches, focal weakness or numbness. Allergic/Immunilogical: See medication list ____________________________________________   PHYSICAL EXAM:  VITAL SIGNS: ED Triage Vitals [09/21/17 1113]  Enc Vitals Group     BP      Pulse      Resp      Temp      Temp src      SpO2      Weight 130 lb (59 kg)     Height 5\' 1"  (1.549 m)     Head Circumference      Peak Flow      Pain Score  8     Pain Loc      Pain Edu?      Excl. in GC?    Constitutional: Alert and oriented. Well appearing and in no acute distress.  Patient sleeping soundly in exam room and did not respond to normal voice for awakening.  Nurse tech "patient for evaluation. Cardiovascular: Normal rate, regular rhythm. Grossly normal heart sounds.  Good peripheral circulation. Respiratory: Normal respiratory effort.  No retractions. Lungs CTAB. Musculoskeletal: No obvious spinal deformity.  Moderate guarding with palpation of L3 through S1.  Patient has negative straight leg  test.  Patient demonstrates decreased range of motion with extension of the lumbar spine.  Neurologic:  Normal speech and language. No gross focal neurologic deficits are appreciated. No gait instability. Skin:  Skin is warm, dry and intact. No rash noted. Psychiatric: Mood and affect are normal. Speech and behavior are normal.  ____________________________________________   LABS (all labs ordered are listed, but only abnormal results are displayed)  Labs Reviewed  URINALYSIS, COMPLETE (UACMP) WITH MICROSCOPIC - Abnormal; Notable for the following components:      Result Value   Color, Urine YELLOW (*)    APPearance CLOUDY (*)    Hgb urine dipstick SMALL (*)    Squamous Epithelial / LPF 6-30 (*)    All other components within normal limits  POC URINE PREG, ED  POCT PREGNANCY, URINE   ____________________________________________  EKG   ____________________________________________  RADIOLOGY  ED MD interpretation:   Official radiology report(s): Ct Lumbar Spine Wo Contrast  Result Date: 09/21/2017 CLINICAL DATA:  Low back pain.  History of sciatica. EXAM: CT LUMBAR SPINE WITHOUT CONTRAST TECHNIQUE: Multidetector CT imaging of the lumbar spine was performed without intravenous contrast administration. Multiplanar CT image reconstructions were also generated. COMPARISON:  None. FINDINGS: Segmentation: 5 lumbar type vertebrae. Alignment: Normal. Vertebrae: No fracture or suspicious osseous lesion. Preserved intervertebral disc space heights. Unremarkable SI joints. Paraspinal and other soft tissues: Cholecystectomy clips. Disc levels: T12-L1 through L3-4: Negative. L4-5: Mild leftward disc bulging without evidence of stenosis. L5-S1: Mild disc bulging without evidence of stenosis. IMPRESSION: Mild lower lumbar disc bulging without evidence of significant stenosis or acute osseous abnormality. Electronically Signed   By: Sebastian Ache M.D.   On: 09/21/2017 12:13     ____________________________________________   PROCEDURES  Procedure(s) performed: None  Procedures  Critical Care performed: No  ____________________________________________   INITIAL IMPRESSION / ASSESSMENT AND PLAN / ED COURSE  As part of my medical decision making, I reviewed the following data within the electronic MEDICAL RECORD NUMBER Evaluated by EM attending , Notes from prior ED visits and [place] Controlled Substance Database   Radicular back pain secondary to mild disc bulges.  Discussed CT findings with patient.  Patient advised follow-up PCP to consider consult to neurologist.  Patient given discharge care instructions and a work note.     ____________________________________________   FINAL CLINICAL IMPRESSION(S) / ED DIAGNOSES  Final diagnoses:  Radicular low back pain     ED Discharge Orders        Ordered    traMADol (ULTRAM) 50 MG tablet  Every 6 hours PRN     09/21/17 1253    methylPREDNISolone (MEDROL DOSEPAK) 4 MG TBPK tablet     09/21/17 1253    cyclobenzaprine (FLEXERIL) 10 MG tablet  3 times daily PRN     09/21/17 1253       Note:  This document was prepared using Dragon voice recognition software and  may include unintentional dictation errors.    Joni ReiningSmith, Laurier Jasperson K, PA-C 09/21/17 1259    Don PerkingVeronese, [place], MD 09/21/17 [phone]

## 2017-09-21 NOTE — ED Notes (Signed)
Pt denies any injuries or problems with urination, pain in left lower back that radiates across back

## 2017-09-21 NOTE — ED Triage Notes (Signed)
Pt c/o lower back pain that started today, states she is not able to stand up straight. Pt states she has a hx of sciatica.

## 2017-10-07 ENCOUNTER — Encounter: Payer: Self-pay | Admitting: Emergency Medicine

## 2017-10-07 ENCOUNTER — Other Ambulatory Visit: Payer: Self-pay

## 2017-10-07 DIAGNOSIS — K219 Gastro-esophageal reflux disease without esophagitis: Secondary | ICD-10-CM | POA: Insufficient documentation

## 2017-10-07 DIAGNOSIS — F17210 Nicotine dependence, cigarettes, uncomplicated: Secondary | ICD-10-CM | POA: Insufficient documentation

## 2017-10-07 DIAGNOSIS — Z79899 Other long term (current) drug therapy: Secondary | ICD-10-CM | POA: Insufficient documentation

## 2017-10-07 LAB — URINALYSIS, COMPLETE (UACMP) WITH MICROSCOPIC
Bilirubin Urine: NEGATIVE
GLUCOSE, UA: NEGATIVE mg/dL
Ketones, ur: NEGATIVE mg/dL
Leukocytes, UA: NEGATIVE
Nitrite: NEGATIVE
PH: 6 (ref 5.0–8.0)
Protein, ur: NEGATIVE mg/dL
SPECIFIC GRAVITY, URINE: 1.023 (ref 1.005–1.030)

## 2017-10-07 LAB — COMPREHENSIVE METABOLIC PANEL
ALK PHOS: 56 U/L (ref 38–126)
ALT: 30 U/L (ref 14–54)
ANION GAP: 10 (ref 5–15)
AST: 32 U/L (ref 15–41)
Albumin: 4.1 g/dL (ref 3.5–5.0)
BILIRUBIN TOTAL: 0.4 mg/dL (ref 0.3–1.2)
BUN: 13 mg/dL (ref 6–20)
CALCIUM: 9.2 mg/dL (ref 8.9–10.3)
CO2: 23 mmol/L (ref 22–32)
Chloride: 105 mmol/L (ref 101–111)
Creatinine, Ser: 0.77 mg/dL (ref 0.44–1.00)
GFR calc Af Amer: >60 mL/min (ref >60)
GFR calc non Af Amer: >60 mL/min (ref >60)
GLUCOSE: 127 mg/dL — AB (ref 65–99)
Potassium: 4 mmol/L (ref 3.5–5.1)
Sodium: 138 mmol/L (ref 135–145)
TOTAL PROTEIN: 7.6 g/dL (ref 6.5–8.1)

## 2017-10-07 LAB — CBC
HCT: 34.4 % — ABNORMAL LOW (ref 35.0–47.0)
Hemoglobin: 11 g/dL — ABNORMAL LOW (ref 12.0–16.0)
MCH: 24.5 pg — ABNORMAL LOW (ref 26.0–34.0)
MCHC: 31.9 g/dL — AB (ref 32.0–36.0)
MCV: 76.6 fL — ABNORMAL LOW (ref 80.0–100.0)
Platelets: 337 10*3/uL (ref 150–440)
RBC: 4.49 MIL/uL (ref 3.80–5.20)
RDW: 17.1 % — AB (ref 11.5–14.5)
WBC: 9.1 10*3/uL (ref 3.6–11.0)

## 2017-10-07 LAB — LIPASE, BLOOD: Lipase: 26 U/L (ref 11–51)

## 2017-10-07 LAB — POCT PREGNANCY, URINE: Preg Test, Ur: NEGATIVE

## 2017-10-07 MED ORDER — ONDANSETRON 4 MG PO TBDP
4.0000 mg | ORAL_TABLET | Freq: Once | ORAL | Status: AC | PRN
  Administered 2017-10-07: 4 mg via ORAL
  Filled 2017-10-07: qty 1

## 2017-10-07 NOTE — ED Triage Notes (Addendum)
Pt says she's been vomiting daily since Tuesday, 3 times daily; says she's been out the last 3 days from work; went in today and vomited after she arrived; manager told her to come to the ED for a work note; pt says she's also having a headache; reports abd tightening after vomiting; pt in no acute distress; pt c/o burning in her throat after vomiting and rates her pain 6/10

## 2017-10-07 NOTE — ED Notes (Signed)
Patient observed sitting in waiting room.  No acute distress noted.

## 2017-10-08 ENCOUNTER — Emergency Department
Admission: EM | Admit: 2017-10-08 | Discharge: 2017-10-08 | Disposition: A | Discharge status: Home / Self Care | Payer: Self-pay | Attending: Emergency Medicine | Admitting: Emergency Medicine

## 2017-10-08 DIAGNOSIS — K219 Gastro-esophageal reflux disease without esophagitis: Secondary | ICD-10-CM

## 2017-10-08 DIAGNOSIS — R112 Nausea with vomiting, unspecified: Secondary | ICD-10-CM

## 2017-10-08 HISTORY — DX: Enthesopathy, unspecified: M77.9

## 2017-10-08 MED ORDER — FAMOTIDINE 40 MG PO TABS: 40.0000 mg | ORAL_TABLET | Freq: Every day | ORAL | 0 refills | Status: AC

## 2017-10-08 MED ORDER — ONDANSETRON HCL 4 MG PO TABS: 4.0000 mg | ORAL_TABLET | Freq: Every day | ORAL | 0 refills | Status: DC | PRN

## 2017-10-08 MED ORDER — FAMOTIDINE 20 MG PO TABS
40.0000 mg | ORAL_TABLET | Freq: Once | ORAL | Status: AC
  Administered 2017-10-08: 40 mg via ORAL
  Filled 2017-10-08: qty 2

## 2017-10-08 NOTE — ED Notes (Signed)
Went into access patient and patient sound asleep.

## 2017-10-08 NOTE — ED Provider Notes (Signed)
[facility] Emergency Department Provider Note  ___________________________________________   First MD Initiated Contact with Patient 10/08/17 0041     (approximate)  I have reviewed the triage vital signs and the nursing notes.   HISTORY  Chief Complaint Emesis   HPI Krystal Curry is a 37 y.o. female with a history of "heartburn" who is presenting to the emergency department today with several episodes of vomiting since Tuesday.  She says that she has vomited once each day and then vomited work [employer]ar[employer].  She says that she had a burning sensation to her upper abdomen as well as her throat after vomiting.  Says that she has taken Tums for this in the past but is not on anything on a daily basis.  Denying any pain at this time.  Denies any other symptoms such as shortness of breath or chest pain.  Past Medical History:  Diagnosis Date  . Bone spur    "between my shoulder blades"    There are no active problems to display for this patient.   Past Surgical History:  Procedure Laterality Date  . CHOLECYSTECTOMY    . KNEE SURGERY Left   . MULTIPLE TOOTH EXTRACTIONS Bilateral     Prior to Admission medications   Medication Sig Start Date End Date Taking? Authorizing Provider  gabapentin (NEURONTIN) 600 MG tablet Take 600 mg by mouth 3 (three) times daily.   Yes [provider]    Allergies Codeine; Penicillins; Tizanidine; Penicillins; Prednisone; and Tylenol [acetaminophen]  History reviewed. No pertinent family history.  Social History Social History   Tobacco Use  . Smoking status: Current Every Day Smoker    Packs/day: 0.50    Types: Cigarettes  . Smokeless tobacco: Never Used  Substance Use Topics  . Alcohol use: Yes    Frequency: Never    Comment: socially  . Drug use: No    Review of Systems  Constitutional: No fever/chills Eyes: No visual changes. ENT: No sore throat. Cardiovascular: Denies chest  pain. Respiratory: Denies shortness of breath. Gastrointestinal:  No diarrhea.  No constipation. Genitourinary: Negative for dysuria. Musculoskeletal: Negative for back pain. Skin: Negative for rash. Neurological: Negative for headaches, focal weakness or numbness.   ____________________________________________   PHYSICAL EXAM:  VITAL SIGNS: ED Triage Vitals  Enc Vitals Group     BP 10/07/17 1922 134/81     Pulse Rate 10/07/17 1922 (!) 57     Resp 10/07/17 1922 17     Temp 10/07/17 1922 98.4 F (36.9 C)     Temp Source 10/07/17 1922 Oral     SpO2 10/07/17 1922 99 %     Weight 10/07/17 1924 130 lb (59 kg)     Height 10/07/17 1924 5\' 1"  (1.549 m)     Head Circumference --      Peak Flow --      Pain Score 10/07/17 1923 6     Pain Loc --      Pain Edu? --      Excl. in GC? --     Constitutional: Alert and oriented. Well appearing and in no acute distress. Eyes: Conjunctivae are normal.  Head: Atraumatic. Nose: No congestion/rhinnorhea. Mouth/Throat: Mucous membranes are moist.  Neck: No stridor.   Cardiovascular: Normal rate, regular rhythm. Grossly normal heart sounds.   Respiratory: Normal respiratory effort.  No retractions. Lungs CTAB. Gastrointestinal: Soft and nontender. No distention.  Musculoskeletal: No lower extremity tenderness nor edema.  No joint effusions. Neurologic:  Normal speech and language. No gross focal neurologic deficits are appreciated. Skin:  Skin is warm, dry and intact. No rash noted. Psychiatric: Mood and affect are normal. Speech and behavior are normal.  ____________________________________________   LABS (all labs ordered are listed, but only abnormal results are displayed)  Labs Reviewed  COMPREHENSIVE METABOLIC PANEL - Abnormal; Notable for the following components:      Result Value   Glucose, Bld 127 (*)    All other components within normal limits  CBC - Abnormal; Notable for the following components:   Hemoglobin 11.0 (*)     HCT 34.4 (*)    MCV 76.6 (*)    MCH 24.5 (*)    MCHC 31.9 (*)    RDW 17.1 (*)    All other components within normal limits  URINALYSIS, COMPLETE (UACMP) WITH MICROSCOPIC - Abnormal; Notable for the following components:   Color, Urine AMBER (*)    APPearance CLOUDY (*)    Hgb urine dipstick LARGE (*)    Bacteria, UA RARE (*)    Squamous Epithelial / LPF 6-30 (*)    All other components within normal limits  LIPASE, BLOOD  POCT PREGNANCY, URINE  POC URINE PREG, ED   ____________________________________________  EKG  ED ECG REPORT I, Arelia Longestavid M Keimari Glenvil, the attending physician, personally viewed and interpreted this ECG.   Date: 10/08/2017  EKG Time: 0146  Rate: 65  Rhythm: normal sinus rhythm  Axis: Normal  Intervals:none  ST&T Change: No ST segment elevation or depression no abnormal T wave inversion.  ____________________________________________  RADIOLOGY   ____________________________________________   PROCEDURES  Procedure(s) performed:   Procedures  Critical Care performed:   ____________________________________________   INITIAL IMPRESSION / ASSESSMENT AND PLAN / ED COURSE  Pertinent labs & imaging results that were available during my care of the patient were reviewed by me and considered in my medical decision making (see chart for details).  DDX: Gastritis, reflux, abdominal pain, MI, acid burn of the pharynx, esophagitis As part of my medical decision making, I reviewed the following data within the electronic MEDICAL RECORD NUMBER Notes from prior ED visits   ----------------------------------------- 1:59 AM on 10/08/2017 -----------------------------------------  Patient at this time without any complaints.  Will be discharged home with a prescription for Pepcid. ____________________________________________   FINAL CLINICAL IMPRESSION(S) / ED DIAGNOSES  Vomiting.  GERD.    NEW MEDICATIONS STARTED DURING THIS VISIT:  New Prescriptions    No medications on file     Note:  This document was prepared using Dragon voice recognition software and may include unintentional dictation errors.     Myrna BlazerSchaevitz, Cassandr Cederberg Matthew, MD 10/08/17 [phone]

## 2017-10-25 ENCOUNTER — Emergency Department
Admission: EM | Admit: 2017-10-25 | Discharge: 2017-10-25 | Disposition: A | Discharge status: Home / Self Care | Payer: Self-pay | Attending: Emergency Medicine | Admitting: Emergency Medicine

## 2017-10-25 ENCOUNTER — Other Ambulatory Visit: Payer: Self-pay

## 2017-10-25 DIAGNOSIS — F17210 Nicotine dependence, cigarettes, uncomplicated: Secondary | ICD-10-CM | POA: Insufficient documentation

## 2017-10-25 DIAGNOSIS — Z9049 Acquired absence of other specified parts of digestive tract: Secondary | ICD-10-CM | POA: Insufficient documentation

## 2017-10-25 DIAGNOSIS — K0889 Other specified disorders of teeth and supporting structures: Secondary | ICD-10-CM | POA: Insufficient documentation

## 2017-10-25 DIAGNOSIS — Z79899 Other long term (current) drug therapy: Secondary | ICD-10-CM | POA: Insufficient documentation

## 2017-10-25 MED ORDER — KETOROLAC TROMETHAMINE 10 MG PO TABS: 10.0000 mg | ORAL_TABLET | Freq: Four times a day (QID) | ORAL | 0 refills | Status: AC | PRN

## 2017-10-25 MED ORDER — KETOROLAC TROMETHAMINE 30 MG/ML IJ SOLN
30.0000 mg | Freq: Once | INTRAMUSCULAR | Status: AC
  Administered 2017-10-25: 30 mg via INTRAMUSCULAR
  Filled 2017-10-25: qty 1

## 2017-10-25 NOTE — Discharge Instructions (Signed)
OPTIONS FOR DENTAL FOLLOW UP CARE ° °[place] Department of Health and Human Services - Local Safety Net Dental Clinics °http://www.ncdhhs.gov/dph/oralhealth/services/safetynetclinics.htm °  °[facility] ([phone]) ° °[facility] [place] ([phone]) ° °[facility] ([phone] ext [phone]) ° °[facility] ([phone]) ° °[facility] ([phone]) °This clinic caters to the indigent population and is on a lottery system. °Location: °[facility], [address], [place] °Clinic Hours: °Wednesdays from 6pm - 9pm, patients seen by a lottery system. °For dates, call or go to [email or web address].edu/[facility]/patients/[email or web address] °Services: °Cleanings, fillings and simple extractions. °Payment Options: °DENTAL WORK IS FREE OF CHARGE. Bring proof of income or support. °Best way to get seen: °Arrive at 5:15 pm - this is a lottery, NOT first come/first serve, so arriving earlier will not increase your chances of being seen. °  °  °[facility] °[phone] °Select option 1 for emergencies °  °Location: °[facility], [address], [place] °Clinic Hours: °No walk-ins accepted - call the day before to schedule an appointment. °Check in times are 9:30 am and 1:30 pm. °Services: °Simple extractions, temporary fillings, pulpectomy/pulp debridement, uncomplicated abscess drainage. °Payment Options: °PAYMENT IS DUE AT THE TIME OF SERVICE.  Fee is usually $100-200, additional surgical procedures (e.g. abscess drainage) may be extra. °Cash, checks, Visa/MasterCard accepted.  Can file Medicaid if patient is covered for dental - patient should call case worker to check. °No discount for [facility] patients. °Best way to get seen: °MUST call the day before and get onto the schedule. Can usually be seen the next 1-2 days. No walk-ins accepted. °  °  °[facility] °[phone] °   °Location: °[facility], [address], [place] °Clinic Hours: °M, W, Th, F 8am or 1:30pm, Tues 9a or 1:30 - first come/first served. °Services: °Simple extractions, temporary fillings, uncomplicated abscess drainage.  You do not need to be an [place] resident. °Payment Options: °PAYMENT IS DUE AT THE TIME OF SERVICE. °Dental insurance, otherwise sliding scale - bring proof of income or support. °Depending on income and treatment needed, cost is usually $50-200. °Best way to get seen: °Arrive early as it is first come/first served. °  °  °[facility] °[phone] °  °Location: °[address] °Clinic Hours: °Mon-Thu 8a-5p °Services: °Most basic dental services including extractions and fillings. °Payment Options: °PAYMENT IS DUE AT THE TIME OF SERVICE. °Sliding scale, up to 50% off - bring proof if income or support. °Medicaid with dental option accepted. °Best way to get seen: °Call to schedule an appointment, can usually be seen within 2 weeks OR they will try to see walk-ins - show up at 8a or 2p (you may have to wait). °  °  °[facility] °[phone] °[place] RESIDENTS ONLY °  °Location: °[facility], [address], [place], [place] [address] °Clinic Hours: By appointment only. °Monday - Thursday 8am-5pm, Friday 8am-12pm °Services: Cleanings, fillings, extractions. °Payment Options: °PAYMENT IS DUE AT THE TIME OF SERVICE. °Cash, Visa or MasterCard. Sliding scale - $30 minimum per service. °Best way to get seen: °Come in to office, complete packet and make an appointment - need proof of income °or support monies for each household member and proof of [place] residence. °Usually takes about a month to get in. °  °  °[facility] °[phone] °  °Location: °[address]   [place] °Clinic Hours: Walk-in Urgent Care Dental Services are offered Monday-Friday  mornings only. °The numbers of emergencies accepted daily is limited to the number of °providers available. °Maximum 15 - Mondays, Wednesdays & Thursdays °Maximum 10 - Tuesdays & Fridays °Services: °You do not need to be a [place] resident to be seen for a dental emergency. °Emergencies are defined as pain, swelling, abnormal bleeding, or dental trauma. Walkins will receive x-rays if needed. °NOTE: Dental cleaning is not an emergency. °Payment Options: °PAYMENT IS DUE AT THE TIME OF SERVICE. °Minimum co-pay is $40.00 for uninsured patients. °Minimum co-pay is $3.00 for Medicaid with dental coverage. °Dental Insurance is accepted and must be presented at time of visit. °Medicare does not cover dental. °Forms of payment: Cash, credit card, checks. °Best way to get seen: °If not previously registered with the clinic, walk-in dental registration begins at 7:15 am and is on a first come/first serve basis. °If previously registered with the clinic, call to make an appointment. °  °  °[facility] °[phone] °[place] RESIDENTS ONLY °  °Location: °[address], [place], [place] °Clinic Hours: °Mon-Thu 10a-2p °Services: Extractions only! °Payment Options: °FREE (donations accepted) - bring proof of income or support °Best way to get seen: °Call and schedule an appointment OR come at 8am on the 1st Monday of every month (except for holidays) when it is first come/first served. °  °  °[facility] °[phone] °  °Location: °[address], [place] °Clinic Hours: °Friday mornings °Services, Payment Options, Best way to get seen: °Call for info °

## 2017-10-25 NOTE — ED Provider Notes (Signed)
[facility] Emergency Department Provider Note  ____________________________________________  Time seen: Approximately 10:46 PM  I have reviewed the triage vital signs and the nursing notes.   HISTORY  Chief Complaint Dental Pain    HPI Krystal Curry is a 37 y.o. female presents to the emergency department with a broken inferior 32.  Patient reports that he noticed broken tooth last night.  Patient has noticed no facial swelling.  She has not made an appointment with a local dentist.  She presents to the emergency department to address her 10 out of 10 dental pain.  No alleviating measures have been attempted.   Past Medical History:  Diagnosis Date  . Bone spur    "between my shoulder blades"    There are no active problems to display for this patient.   Past Surgical History:  Procedure Laterality Date  . CHOLECYSTECTOMY    . KNEE SURGERY Left   . MULTIPLE TOOTH EXTRACTIONS Bilateral     Prior to Admission medications   Medication Sig Start Date End Date Taking? Authorizing Provider  famotidine (PEPCID) 40 MG tablet Take 1 tablet (40 mg total) by mouth at bedtime. 10/08/17 10/08/18  Myrna Blazer, MD  gabapentin (NEURONTIN) 600 MG tablet Take 600 mg by mouth 3 (three) times daily.    [provider]  ketorolac (TORADOL) 10 MG tablet Take 1 tablet (10 mg total) by mouth every 6 (six) hours as needed for up to 5 days. 10/25/17 10/30/17  Orvil Feil, PA-C  ondansetron (ZOFRAN) 4 MG tablet Take 1 tablet (4 mg total) by mouth daily as needed. 10/08/17   Myrna Blazer, MD    Allergies Codeine; Penicillins; Tizanidine; Penicillins; Prednisone; and Tylenol [acetaminophen]  No family history on file.  Social History Social History   Tobacco Use  . Smoking status: Current Every Day Smoker    Packs/day: 0.50    Types: Cigarettes  . Smokeless tobacco: Never Used  Substance Use Topics  . Alcohol use: Yes     Frequency: Never    Comment: socially  . Drug use: No     Review of Systems  Constitutional: No fever/chills Eyes: No visual changes. No discharge ENT: Patient has broken Inferior 32. Cardiovascular: no chest pain. Respiratory: no cough. No SOB. Gastrointestinal: No abdominal pain.  No nausea, no vomiting.  No diarrhea.  No constipation. Musculoskeletal: Negative for musculoskeletal pain. Skin: Negative for rash, abrasions, lacerations, ecchymosis. Neurological: Negative for headaches, focal weakness or numbness.  ____________________________________________   PHYSICAL EXAM:  VITAL SIGNS: ED Triage Vitals  Enc Vitals Group     BP 10/25/17 2111 138/76     Pulse Rate 10/25/17 2111 (!) 101     Resp 10/25/17 2111 18     Temp 10/25/17 2111 98.9 F (37.2 C)     Temp Source 10/25/17 2111 Oral     SpO2 10/25/17 2111 98 %     Weight 10/25/17 2110 127 lb (57.6 kg)     Height 10/25/17 2110 5\' 1"  (1.549 m)     Head Circumference --      Peak Flow --      Pain Score 10/25/17 2110 10     Pain Loc --      Pain Edu? --      Excl. in GC? --      Constitutional: Alert and oriented. Well appearing and in no acute distress. Eyes: Conjunctivae are normal. PERRL. EOMI. Head: Atraumatic. ENT:  Ears: TMs are pearly.      Nose: No congestion/rhinnorhea.      Mouth/Throat: Mucous membranes are moist.  Patient has broken inferior 32. Neck: No stridor.  No cervical spine tenderness to palpation. Cardiovascular: Normal rate, regular rhythm. Normal S1 and S2.  Good peripheral circulation. Respiratory: Normal respiratory effort without tachypnea or retractions. Lungs CTAB. Good air entry to the bases with no decreased or absent breath sounds. Skin:  Skin is warm, dry and intact. No rash noted.   ____________________________________________   LABS (all labs ordered are listed, but only abnormal results are displayed)  Labs Reviewed - No data to  display ____________________________________________  EKG   ____________________________________________  RADIOLOGY   No results found.  ____________________________________________    PROCEDURES  Procedure(s) performed:    Procedures    Medications  ketorolac (TORADOL) 30 MG/ML injection 30 mg (has no administration in time range)     ____________________________________________   INITIAL IMPRESSION / ASSESSMENT AND PLAN / ED COURSE  Pertinent labs & imaging results that were available during my care of the patient were reviewed by me and considered in my medical decision making (see chart for details).  Review of the [place] CSRS was performed in accordance of the [facility] prior to dispensing any controlled drugs.     Assessment and plan Dental pain Patient presents to the emergency department with a broken inferior 32.  Patient was given an injection of Toradol in the emergency department and discharged with Toradol by mouth.  Vital signs are reassuring prior to discharge.  All patient questions were answered.   ____________________________________________  FINAL CLINICAL IMPRESSION(S) / ED DIAGNOSES  Final diagnoses:  Pain, dental      NEW MEDICATIONS STARTED DURING THIS VISIT:  ED Discharge Orders        Ordered    ketorolac (TORADOL) 10 MG tablet  Every 6 hours PRN     10/25/17 2215          This chart was dictated using voice recognition software/Dragon. Despite best efforts to proofread, errors can occur which can change the meaning. Any change was purely unintentional.    Orvil FeilWoods, Jaclyn M, PA-C 10/25/17 2250    Phineas SemenGoodman, [place], MD 10/25/17 [phone]

## 2017-10-25 NOTE — ED Triage Notes (Signed)
Pt arrives to ED via POV with c/o RIGHT-lower dental pain. Pt states a "piece of the tooth broke off last night" and her s/o states she was eating some "oncooked Ramen noodles today and got a piece stuck in the cavity". No facial swelling noted, pt is able to breath and swallow without difficulty.

## 2017-12-05 ENCOUNTER — Encounter: Payer: Self-pay | Admitting: Emergency Medicine

## 2017-12-05 ENCOUNTER — Other Ambulatory Visit: Payer: Self-pay

## 2017-12-05 ENCOUNTER — Emergency Department
Admission: EM | Admit: 2017-12-05 | Discharge: 2017-12-05 | Disposition: A | Discharge status: Home / Self Care | Payer: Self-pay | Attending: Emergency Medicine | Admitting: Emergency Medicine

## 2017-12-05 DIAGNOSIS — M545 Low back pain, unspecified: Secondary | ICD-10-CM

## 2017-12-05 DIAGNOSIS — G8929 Other chronic pain: Secondary | ICD-10-CM

## 2017-12-05 DIAGNOSIS — Z79899 Other long term (current) drug therapy: Secondary | ICD-10-CM | POA: Insufficient documentation

## 2017-12-05 DIAGNOSIS — F17210 Nicotine dependence, cigarettes, uncomplicated: Secondary | ICD-10-CM | POA: Insufficient documentation

## 2017-12-05 DIAGNOSIS — B9689 Other specified bacterial agents as the cause of diseases classified elsewhere: Secondary | ICD-10-CM

## 2017-12-05 DIAGNOSIS — N760 Acute vaginitis: Secondary | ICD-10-CM | POA: Insufficient documentation

## 2017-12-05 LAB — WET PREP, GENITAL
SPERM: NONE SEEN
TRICH WET PREP: NONE SEEN
Yeast Wet Prep HPF POC: NONE SEEN

## 2017-12-05 LAB — URINALYSIS, COMPLETE (UACMP) WITH MICROSCOPIC
Bacteria, UA: NONE SEEN
Bilirubin Urine: NEGATIVE
GLUCOSE, UA: NEGATIVE mg/dL
Hgb urine dipstick: NEGATIVE
Ketones, ur: NEGATIVE mg/dL
Leukocytes, UA: NEGATIVE
Nitrite: NEGATIVE
PH: 5 (ref 5.0–8.0)
PROTEIN: NEGATIVE mg/dL
Specific Gravity, Urine: 1.024 (ref 1.005–1.030)

## 2017-12-05 LAB — POCT PREGNANCY, URINE: Preg Test, Ur: NEGATIVE

## 2017-12-05 MED ORDER — METRONIDAZOLE 500 MG PO TABS: 500.0000 mg | ORAL_TABLET | Freq: Two times a day (BID) | ORAL | 0 refills | Status: AC

## 2017-12-05 MED ORDER — CYCLOBENZAPRINE HCL 5 MG PO TABS: 5.0000 mg | ORAL_TABLET | Freq: Three times a day (TID) | ORAL | 0 refills | Status: DC | PRN

## 2017-12-05 NOTE — ED Triage Notes (Signed)
Developed some abd discomfort and dysuria for about 1 week  Low having pain to lower back

## 2017-12-05 NOTE — ED Notes (Signed)
See triage note  States she has had intermittent dysuria and lower abd discomfort for about 1 week  Positive nausea   No fever or vomiting.  States pain in abd feels like her ovaries are hurting

## 2017-12-05 NOTE — Discharge Instructions (Addendum)
Your exam is consistent with your chronic back pain, as well as a bacterial vaginosis infection. Take the antibiotic as directed. Take the muscle relaxant as needed. Follow-up with your provider for ongoing symptoms.

## 2017-12-06 NOTE — ED Provider Notes (Signed)
[facility] Emergency Department Provider Note ____________________________________________  Time seen: 1101  I have reviewed the triage vital signs and the nursing notes.  HISTORY  Chief Complaint  Dysuria  HPI Krystal Curry is a 37 y.o. female resents herself to the ED from work, for evaluation of lower abdominal discomfort as well as some dysuria.  She describes her symptoms have persisted for the last week.  She denies any hematuria or abnormal vaginal bleeding.  She reports her LMP was about 5 days prior.  She does report yesterday having a single episode of a gush of vaginal discharge, that she describes as clear and mucoid in nature.  She denies any fevers, chills, or sweats.  She gives a history of chronic low back pain for which she takes daily doses of ibuprofen.  She denies any recent injury, accident, or trauma.  Past Medical History:  Diagnosis Date  . Bone spur    "between my shoulder blades"    There are no active problems to display for this patient.   Past Surgical History:  Procedure Laterality Date  . CHOLECYSTECTOMY    . KNEE SURGERY Left   . MULTIPLE TOOTH EXTRACTIONS Bilateral     Prior to Admission medications   Medication Sig Start Date End Date Taking? Authorizing Provider  cyclobenzaprine (FLEXERIL) 5 MG tablet Take 1 tablet (5 mg total) by mouth 3 (three) times daily as needed for muscle spasms. 12/05/17   Niajah Sipos, Charlesetta Ivory, PA-C  famotidine (PEPCID) 40 MG tablet Take 1 tablet (40 mg total) by mouth at bedtime. 10/08/17 10/08/18  Myrna Blazer, MD  gabapentin (NEURONTIN) 600 MG tablet Take 600 mg by mouth 3 (three) times daily.    [provider]  metroNIDAZOLE (FLAGYL) 500 MG tablet Take 1 tablet (500 mg total) by mouth 2 (two) times daily for 7 days. 12/05/17 12/12/17  Bonnita Newby, Charlesetta Ivory, PA-C  ondansetron (ZOFRAN) 4 MG tablet Take 1 tablet (4 mg total) by mouth daily as needed. 10/08/17   Myrna Blazer, MD    Allergies Codeine; Penicillins; Tizanidine; Penicillins; Prednisone; and Tylenol [acetaminophen]  No family history on file.  Social History Social History   Tobacco Use  . Smoking status: Current Every Day Smoker    Packs/day: 0.50    Types: Cigarettes  . Smokeless tobacco: Never Used  Substance Use Topics  . Alcohol use: Yes    Frequency: Never    Comment: socially  . Drug use: No    Review of Systems  Constitutional: Negative for fever. Cardiovascular: Negative for chest pain. Respiratory: Negative for shortness of breath. Gastrointestinal: Negative for abdominal pain, vomiting and diarrhea. Genitourinary: Positive for dysuria. Musculoskeletal: Negative for back pain. Skin: Negative for rash. Neurological: Negative for headaches, focal weakness or numbness. ____________________________________________  PHYSICAL EXAM:  VITAL SIGNS: ED Triage Vitals  Enc Vitals Group     BP 12/05/17 1043 125/64     Pulse Rate 12/05/17 1043 96     Resp 12/05/17 1043 18     Temp 12/05/17 1043 98.4 F (36.9 C)     Temp Source 12/05/17 1043 Oral     SpO2 12/05/17 1043 99 %     Weight 12/05/17 1043 127 lb (57.6 kg)     Height 12/05/17 1043  (1.549 m)     Head Circumference --      Peak Flow --      Pain Score 12/05/17 1048 7     Pain  Loc --      Pain Edu? --      Excl. in GC? --     Constitutional: Alert and oriented. Well appearing and in no distress. Head: Normocephalic and atraumatic. Cardiovascular: Normal rate, regular rhythm. Normal distal pulses. Respiratory: Normal respiratory effort. No wheezes/rales/rhonchi. Gastrointestinal: Soft and nontender. No distention. GU: normal external genitalia. Closed cervix with moderate clear, mucoid drainage. No CMT or friability noted.  Musculoskeletal: Normal spinal alignment without significant midline tenderness, spasm, deformity, or step-off.  Nontender with normal range of motion in all extremities.   Neurologic:  Normal gait without ataxia. Normal speech and language. No gross focal neurologic deficits are appreciated. Skin:  Skin is warm, dry and intact. No rash noted. Psychiatric: Mood and affect are normal. Patient exhibits appropriate insight and judgment. ____________________________________________   LABS (pertinent positives/negatives) Labs Reviewed  WET PREP, GENITAL - Abnormal; Notable for the following components:      Result Value   Clue Cells Wet Prep HPF POC FEW (*)    WBC, Wet Prep HPF POC FEW (*)    All other components within normal limits  URINALYSIS, COMPLETE (UACMP) WITH MICROSCOPIC - Abnormal; Notable for the following components:   Color, Urine YELLOW (*)    APPearance CLOUDY (*)    All other components within normal limits  POC URINE PREG, ED  POCT PREGNANCY, URINE  ____________________________________________  INITIAL IMPRESSION / ASSESSMENT AND PLAN / ED COURSE  Patient with ED evaluation of 1 week complaint of lower abdominal pain as well as some low back pain as well.  She has a wet prep that confirms clue cells.  She will be discharged with a prescription for metronidazole for her BV, and cycle Benzapril for her ongoing back pain.  Patient is reassured overall by her exam findings.  She will follow-up with her primary [facility] for ongoing symptoms.  A work note is provided as requested. ____________________________________________  FINAL CLINICAL IMPRESSION(S) / ED DIAGNOSES  Final diagnoses:  BV (bacterial vaginosis)  Chronic bilateral low back pain without sciatica      Lissa Hoard, PA-C 12/06/17 1731    Sharman Cheek, MD 12/07/17 [phone]

## 2018-03-10 ENCOUNTER — Emergency Department
Admission: EM | Admit: 2018-03-10 | Discharge: 2018-03-10 | Disposition: A | Discharge status: Home / Self Care | Payer: Self-pay | Attending: Emergency Medicine | Admitting: Emergency Medicine

## 2018-03-10 ENCOUNTER — Encounter: Payer: Self-pay | Admitting: Emergency Medicine

## 2018-03-10 ENCOUNTER — Other Ambulatory Visit: Payer: Self-pay

## 2018-03-10 DIAGNOSIS — F17210 Nicotine dependence, cigarettes, uncomplicated: Secondary | ICD-10-CM | POA: Insufficient documentation

## 2018-03-10 DIAGNOSIS — Z23 Encounter for immunization: Secondary | ICD-10-CM | POA: Insufficient documentation

## 2018-03-10 DIAGNOSIS — Z79899 Other long term (current) drug therapy: Secondary | ICD-10-CM | POA: Insufficient documentation

## 2018-03-10 DIAGNOSIS — N764 Abscess of vulva: Secondary | ICD-10-CM | POA: Insufficient documentation

## 2018-03-10 MED ORDER — LIDOCAINE HCL (PF) 1 % IJ SOLN
5.0000 mL | Freq: Once | INTRAMUSCULAR | Status: DC
  Filled 2018-03-10: qty 5

## 2018-03-10 MED ORDER — CLINDAMYCIN HCL 150 MG PO CAPS
300.0000 mg | ORAL_CAPSULE | Freq: Once | ORAL | Status: AC
  Administered 2018-03-10: 300 mg via ORAL
  Filled 2018-03-10: qty 2

## 2018-03-10 MED ORDER — TETANUS-DIPHTH-ACELL PERTUSSIS 5-2.5-18.5 LF-MCG/0.5 IM SUSP
0.5000 mL | Freq: Once | INTRAMUSCULAR | Status: AC
Start: 2018-03-10 — End: 2018-03-10
  Administered 2018-03-10: 0.5 mL via INTRAMUSCULAR
  Filled 2018-03-10: qty 0.5

## 2018-03-10 MED ORDER — HYDROCODONE-ACETAMINOPHEN 5-325 MG PO TABS
1.0000 | ORAL_TABLET | Freq: Once | ORAL | Status: AC
  Administered 2018-03-10: 1 via ORAL
  Filled 2018-03-10: qty 1

## 2018-03-10 MED ORDER — CLINDAMYCIN HCL 150 MG PO CAPS: 300.0000 mg | ORAL_CAPSULE | Freq: Three times a day (TID) | ORAL | 0 refills | Status: AC

## 2018-03-10 MED ORDER — HYDROCODONE-ACETAMINOPHEN 5-325 MG PO TABS: 1.0000 | ORAL_TABLET | Freq: Four times a day (QID) | ORAL | 0 refills | Status: DC | PRN

## 2018-03-10 MED ORDER — MORPHINE SULFATE (PF) 4 MG/ML IV SOLN
4.0000 mg | Freq: Once | INTRAVENOUS | Status: AC
  Administered 2018-03-10: 4 mg via INTRAVENOUS
  Filled 2018-03-10: qty 1

## 2018-03-10 NOTE — Discharge Instructions (Addendum)
Follow-up with your regular doctor if not better in 3 to 5 days.  Continue soak in warm water with Epson salts.  Take the antibiotics as prescribed.  If you need pain medication try Advil or Aleve.  If these medications do not work then you may take the Vicodin for pain.  If you are taking the narcotic you need to also take a stool softener so you will not get constipated.  Return to the emergency department if the area is becoming more red and swollen.

## 2018-03-10 NOTE — ED Provider Notes (Signed)
[facility] Emergency Department Provider Note  ____________________________________________   First MD Initiated Contact with Patient 03/10/18 2118     (approximate)  I have reviewed the triage vital signs and the nursing notes.   HISTORY  Chief Complaint Cyst    HPI Krystal Curry is a 37 y.o. female presents emergency department complaining of a swollen area on the right labia for 3 days.  She had tried to soak in warm water but the areas continue to get worse.  She states it is becoming hard to sit and painful when her legs rub together.  She denies any fever or chills.  She has had no other abscesses.    Past Medical History:  Diagnosis Date  . Bone spur    "between my shoulder blades"    There are no active problems to display for this patient.   Past Surgical History:  Procedure Laterality Date  . CHOLECYSTECTOMY    . KNEE SURGERY Left   . MULTIPLE TOOTH EXTRACTIONS Bilateral     Prior to Admission medications   Medication Sig Start Date End Date Taking? Authorizing Provider  clindamycin (CLEOCIN) 150 MG capsule Take 2 capsules (300 mg total) by mouth 3 (three) times daily. 03/10/18   Fisher, Roselyn Bering, PA-C  famotidine (PEPCID) 40 MG tablet Take 1 tablet (40 mg total) by mouth at bedtime. 10/08/17 10/08/18  Myrna Blazer, MD  gabapentin (NEURONTIN) 600 MG tablet Take 600 mg by mouth 3 (three) times daily.    [provider]  HYDROcodone-acetaminophen (NORCO/VICODIN) 5-325 MG tablet Take 1 tablet by mouth every 6 (six) hours as needed for moderate pain. 03/10/18   Faythe Ghee, PA-C    Allergies Codeine; Penicillins; Tizanidine; Penicillins; Prednisone; and Tylenol [acetaminophen]  No family history on file.  Social History Social History   Tobacco Use  . Smoking status: Current Every Day Smoker    Packs/day: 0.50    Types: Cigarettes  . Smokeless tobacco: Never Used  Substance Use Topics  . Alcohol use:  Yes    Frequency: Never    Comment: socially  . Drug use: No    Review of Systems  Constitutional: No fever/chills Eyes: No visual changes. ENT: No sore throat. Respiratory: Denies cough Genitourinary: Negative for dysuria.  Abscess on the labia Musculoskeletal: Negative for back pain. Skin: Negative for rash.    ____________________________________________   PHYSICAL EXAM:  VITAL SIGNS: ED Triage Vitals  Enc Vitals Group     BP 03/10/18 2009 135/79     Pulse Rate 03/10/18 2009 96     Resp 03/10/18 2009 18     Temp 03/10/18 2009 98.9 F (37.2 C)     Temp Source 03/10/18 2009 Oral     SpO2 03/10/18 2009 100 %     Weight 03/10/18 2010 127 lb (57.6 kg)     Height 03/10/18 2010 5\' 1"  (1.549 m)     Head Circumference --      Peak Flow --      Pain Score 03/10/18 2010 8     Pain Loc --      Pain Edu? --      Excl. in GC? --     Constitutional: Alert and oriented. Well appearing and in no acute distress. Eyes: Conjunctivae are normal.  Head: Atraumatic. Nose: No congestion/rhinnorhea. Mouth/Throat: Mucous membranes are moist.   Neck:  supple no lymphadenopathy noted Cardiovascular: Normal rate, regular rhythm.  Respiratory: Normal respiratory effort.  No  retractions GU: The external labia has a marble sized abscess noted on the right lower aspect.  The area is fluctuant.  The area surrounding the fluctuance is hard and indurated. Musculoskeletal: FROM all extremities, warm and well perfused Neurologic:  Normal speech and language.  Skin:  Skin is warm, dry and intact. No rash noted.  Positive for abscess on the right labia Psychiatric: Mood and affect are normal. Speech and behavior are normal.  ____________________________________________   LABS (all labs ordered are listed, but only abnormal results are displayed)  Labs Reviewed - No data to  display ____________________________________________   ____________________________________________  RADIOLOGY    ____________________________________________   PROCEDURES  Procedure(s) performed: Saline lock, feeding 4 mg IV  .Marland KitchenIncision and Drainage Date/Time: 03/10/2018 10:36 PM Performed by: Faythe Ghee, PA-C Authorized by: Faythe Ghee, PA-C   Consent:    Consent obtained:  Verbal   Consent given by:  Patient   Risks discussed:  Bleeding, incomplete drainage, pain and infection   Alternatives discussed:  Delayed treatment Location:    Type:  Abscess   Location:  Anogenital Pre-procedure details:    Skin preparation:  Betadine Anesthesia (see MAR for exact dosages):    Anesthesia method:  Local infiltration   Local anesthetic:  Lidocaine 1% w/o epi Procedure type:    Complexity:  Simple Procedure details:    Incision types:  Stab incision   Incision depth:  Dermal   Scalpel blade:  11   Wound management:  Probed and deloculated and irrigated with saline   Drainage:  Bloody and purulent   Drainage amount:  Moderate   Wound treatment:  Wound left open Post-procedure details:    Patient tolerance of procedure:  Tolerated well, no immediate complications Comments:     Abscess was on the right lower labia      ____________________________________________   INITIAL IMPRESSION / ASSESSMENT AND PLAN / ED COURSE  Pertinent labs & imaging results that were available during my care of the patient were reviewed by me and considered in my medical decision making (see chart for details).   Patient is 37 year old female presents emergency department complaining of an abscess on the right lower labia.  She denies any fever or chills.  She has not had any previous history of abscesses.  On physical exam the right labia has a marble size swollen area which is fluctuant.  The area is not draining at this time.  Incision and drainage was performed after the patient  had been given morphine 4 mg IV and the lidocaine had been inserted into the abscess area.  There was a moderate amount of purulent drainage noted.  Patient tolerated procedure well.  Explained all of the findings to the patient.  She was given a dose of clindamycin 300 mg p.o. while here in the ED.  Vicodin 1 p.o.  She was given a prescription for clindamycin 600 mg 3 times daily for 7 days.  She was also given a prescription for Vicodin if needed.  She is to follow-up with her regular doctor if not better in 3 to 5 days.  Return to the emergency department if worsening.  She was instructed to soak in warm water with Epson salts.  She states she understands will comply with our instructions.  She was discharged in stable condition.     As part of my medical decision making, I reviewed the following data within the electronic MEDICAL RECORD NUMBER Nursing notes reviewed and incorporated, Old chart  reviewed, Notes from prior ED visits and [place] Controlled Substance Database  ____________________________________________   FINAL CLINICAL IMPRESSION(S) / ED DIAGNOSES  Final diagnoses:  Labial abscess      NEW MEDICATIONS STARTED DURING THIS VISIT:  New Prescriptions   CLINDAMYCIN (CLEOCIN) 150 MG CAPSULE    Take 2 capsules (300 mg total) by mouth 3 (three) times daily.   HYDROCODONE-ACETAMINOPHEN (NORCO/VICODIN) 5-325 MG TABLET    Take 1 tablet by mouth every 6 (six) hours as needed for moderate pain.     Note:  This document was prepared using Dragon voice recognition software and may include unintentional dictation errors.    Faythe GheeFisher, Susan W, PA-C 03/10/18 2240    Schaevitz, Myra Rudeavid Matthew, MD 03/10/18 [phone]

## 2018-03-10 NOTE — ED Notes (Addendum)
Pt stated that about 3 days ago she was using the bathroom and when she went to wipe she felt a "bubble" on the right side of her labia. Has gotten progressively worse at days go by, can barely walk or sit. Family at bedside. Pt took Ibuprofen and did a warm bath with no relief.

## 2018-03-10 NOTE — ED Triage Notes (Signed)
Cyst R side of vagina x 3 days.

## 2020-01-09 ENCOUNTER — Encounter (HOSPITAL_COMMUNITY): Payer: Self-pay

## 2020-01-09 ENCOUNTER — Other Ambulatory Visit: Payer: Self-pay

## 2020-01-09 ENCOUNTER — Emergency Department (HOSPITAL_COMMUNITY)
Admission: EM | Admit: 2020-01-09 | Discharge: 2020-01-09 | Disposition: A | Discharge status: Home / Self Care | Payer: Medicaid - Out of State | Attending: Emergency Medicine | Admitting: Emergency Medicine

## 2020-01-09 ENCOUNTER — Emergency Department (HOSPITAL_COMMUNITY): Payer: Medicaid - Out of State

## 2020-01-09 DIAGNOSIS — R0789 Other chest pain: Secondary | ICD-10-CM

## 2020-01-09 DIAGNOSIS — F17210 Nicotine dependence, cigarettes, uncomplicated: Secondary | ICD-10-CM | POA: Insufficient documentation

## 2020-01-09 DIAGNOSIS — R109 Unspecified abdominal pain: Secondary | ICD-10-CM | POA: Insufficient documentation

## 2020-01-09 DIAGNOSIS — R079 Chest pain, unspecified: Secondary | ICD-10-CM | POA: Diagnosis present

## 2020-01-09 LAB — CBC
HCT: 39.3 % (ref 36.0–46.0)
Hemoglobin: 12.7 g/dL (ref 12.0–15.0)
MCH: 27.2 pg (ref 26.0–34.0)
MCHC: 32.3 g/dL (ref 30.0–36.0)
MCV: 84.2 fL (ref 80.0–100.0)
Platelets: 310 10*3/uL (ref 150–400)
RBC: 4.67 MIL/uL (ref 3.87–5.11)
RDW: 14.7 % (ref 11.5–15.5)
WBC: 10.7 10*3/uL — ABNORMAL HIGH (ref 4.0–10.5)
nRBC: 0 % (ref 0.0–0.2)

## 2020-01-09 LAB — BASIC METABOLIC PANEL
Anion gap: 8 (ref 5–15)
BUN: 8 mg/dL (ref 6–20)
CO2: 24 mmol/L (ref 22–32)
Calcium: 9.4 mg/dL (ref 8.9–10.3)
Chloride: 107 mmol/L (ref 98–111)
Creatinine, Ser: 0.81 mg/dL (ref 0.44–1.00)
GFR calc Af Amer: >60 mL/min (ref >60)
GFR calc non Af Amer: >60 mL/min (ref >60)
Glucose, Bld: 100 mg/dL — ABNORMAL HIGH (ref 70–99)
Potassium: 4.1 mmol/L (ref 3.5–5.1)
Sodium: 139 mmol/L (ref 135–145)

## 2020-01-09 LAB — I-STAT BETA HCG BLOOD, ED (MC, WL, AP ONLY): I-stat hCG, quantitative: <5 m[IU]/mL (ref <5)

## 2020-01-09 LAB — TROPONIN I (HIGH SENSITIVITY): Troponin I (High Sensitivity): <2 ng/L (ref <18)

## 2020-01-09 MED ORDER — SODIUM CHLORIDE 0.9% FLUSH: 3.0000 mL | Freq: Once | INTRAVENOUS | Status: DC

## 2020-01-09 NOTE — ED Provider Notes (Signed)
[facility]-EMERGENCY DEPT Provider Note   CSN: [id number] Arrival date & time: 01/09/20  1417     History Chief Complaint  Patient presents with  . Chest Pain  . Abdominal Pain    Krystal Curry is a 39 y.o. female.  The history is provided by the patient and medical records. No language interpreter was used.  Chest Pain Associated symptoms: abdominal pain   Abdominal Pain Associated symptoms: chest pain    Krystal Curry is a 39 y.o. female who presents to the Emergency Department complaining of chest pain. She complains of left sided chest/breast pain that started 1-2 months ago.  Pain starts in the left breast and radiates to the LUQ and back.  Pain is constant, sharp in nature.  Pain is worse when she lays on her back, walking, sitting.  Pain eases if she lays on her right side.  She has sore throat/swelling in throat for a few months.  Her left ear feels like there is something in it.  She went to the [facility] ER (one month ago) and her PCP.  She is scheduled for a CT scan in September to r/o aneurysm.  She has lightheadedness.  She went to [facility] ER two weeks ago for sore throat and was treated with meclizine.    Denies associated fevers, vomiting, diarrhea, dysuria.  Has mild sob, mild morning cough.  Sometimes is nauseated.  Has no known medical problems.   Smokes tobacco, denies alcohol.      Past Medical History:  Diagnosis Date  . Bone spur    "between my shoulder blades"    There are no problems to display for this patient.   Past Surgical History:  Procedure Laterality Date  . CHOLECYSTECTOMY    . KNEE SURGERY Left   . MULTIPLE TOOTH EXTRACTIONS Bilateral      OB History   No obstetric history on file.     Family History  Family history unknown: Yes    Social History   Tobacco Use  . Smoking status: Current Every Day Smoker    Packs/day: 0.35    Types: Cigarettes  . Smokeless tobacco: Never Used  Vaping Use  .  Vaping Use: Never used  Substance Use Topics  . Alcohol use: Not Currently    Comment: socially  . Drug use: No    Home Medications Prior to Admission medications   Medication Sig Start Date End Date Taking? Authorizing Provider  clindamycin (CLEOCIN) 150 MG capsule Take 2 capsules (300 mg total) by mouth 3 (three) times daily. 03/10/18   Fisher, Roselyn Bering, PA-C  famotidine (PEPCID) 40 MG tablet Take 1 tablet (40 mg total) by mouth at bedtime. 10/08/17 10/08/18  Myrna Blazer, MD  gabapentin (NEURONTIN) 600 MG tablet Take 600 mg by mouth 3 (three) times daily.    [provider]  HYDROcodone-acetaminophen (NORCO/VICODIN) 5-325 MG tablet Take 1 tablet by mouth every 6 (six) hours as needed for moderate pain. 03/10/18   Faythe Ghee, PA-C    Allergies    Codeine, Penicillins, Tizanidine, Penicillins, Prednisone, and Tylenol [acetaminophen]  Review of Systems   Review of Systems  Cardiovascular: Positive for chest pain.  Gastrointestinal: Positive for abdominal pain.  All other systems reviewed and are negative.   Physical Exam Updated Vital Signs BP 111/75   Pulse 75   Temp 98.1 F (36.7 C) (Oral)   Resp 18   Ht 5\' 1"  (1.549 m)   Wt  57.6 kg   LMP 12/10/2019   SpO2 100%   BMI 24.00 kg/m   Physical Exam Vitals and nursing note reviewed.  Constitutional:      Appearance: She is well-developed.  HENT:     Head: Normocephalic and atraumatic.     Mouth/Throat:     Mouth: Mucous membranes are moist.     Pharynx: No oropharyngeal exudate or posterior oropharyngeal erythema.  Cardiovascular:     Rate and Rhythm: Normal rate and regular rhythm.     Heart sounds: No murmur heard.   Pulmonary:     Effort: Pulmonary effort is normal. No respiratory distress.     Breath sounds: Normal breath sounds.     Comments: TTP over left anterior chest, left upper back without overlying lesions or masses Abdominal:     Palpations: Abdomen is soft.     Tenderness:  There is no abdominal tenderness. There is no guarding or rebound.  Musculoskeletal:        General: No swelling or tenderness.     Comments: 2+ DP pulses bilaterally  Skin:    General: Skin is warm and dry.  Neurological:     Mental Status: She is alert and oriented to person, place, and time.     Comments: 5/5 strength in all four extremities with sensation to light touch intact in all four extremities.    Psychiatric:        Behavior: Behavior normal.     ED Results / Procedures / Treatments   Labs (all labs ordered are listed, but only abnormal results are displayed) Labs Reviewed  BASIC METABOLIC PANEL - Abnormal; Notable for the following components:      Result Value   Glucose, Bld 100 (*)    All other components within normal limits  CBC - Abnormal; Notable for the following components:   WBC 10.7 (*)    All other components within normal limits  I-STAT BETA HCG BLOOD, ED ([facility], [facility], [facility] ONLY)  TROPONIN I (HIGH SENSITIVITY)  TROPONIN I (HIGH SENSITIVITY)    EKG EKG Interpretation  Date/Time:  Thursday January 09 2020 14:25:59 EDT Ventricular Rate:  92 PR Interval:    QRS Duration: 68 QT Interval:  336 QTC Calculation: 416 R Axis:   69 Text Interpretation: Sinus rhythm Borderline short PR interval Low voltage, extremity and precordial leads Baseline wander in lead(s) III V1 12 Lead; Mason-Likar Confirmed by Tilden Fossa [id number]) on 01/09/2020 5:38:24 PM   Radiology DG Chest 2 View  Result Date: 01/09/2020 CLINICAL DATA:  Chest pain EXAM: CHEST - 2 VIEW COMPARISON:  11/09/2019 FINDINGS: The heart size and mediastinal contours are within normal limits. Both lungs are clear. The visualized skeletal structures are unremarkable. IMPRESSION: No active cardiopulmonary disease. Electronically Signed   By: Marlan Palau M.D.   On: 01/09/2020 15:34    Procedures Procedures (including critical care time)  Medications Ordered in ED Medications  sodium chloride flush (NS) 0.9  % injection 3 mL (3 mLs Intravenous Not Given 01/09/20 1658)    ED Course  I have reviewed the triage vital signs and the nursing notes.  Pertinent labs & imaging results that were available during my care of the patient were reviewed by me and considered in my medical decision making (see chart for details).    MDM Rules/Calculators/A&P                         Pt here for evaluation of  1-2 months of left sided chest pain , reproducible on exam.  Presentation is not c/w ACS, PE, dissection, acute abdomen, epidural abscess.  D/w pt home care for chest wall pain, outpatient follow up and return precautions.    Final Clinical Impression(s) / ED Diagnoses Final diagnoses:  Chest wall pain    Rx / DC Orders ED Discharge Orders    None       Tilden Fossa, MD 01/09/20 2204

## 2020-01-09 NOTE — ED Triage Notes (Addendum)
Patient states she was being treated for a pulled muscle in the left chest approx one month. Patient states she woke today with chest pain under her left breast. Patient denies any SOB,N/V. Patient c/o LUQ pain x 1 month.  Patient also added that she has a sore throat x a couple of months. Patient states she went to the hospital in [place] for that. Patient also states she went to [facility] for the left chest pain mentioned above.

## 2020-01-09 NOTE — ED Notes (Signed)
Pt verbalizes understanding of DC instructions. Pt belongings returned and is ambulatory out of ED.  

## 2020-03-17 ENCOUNTER — Encounter (HOSPITAL_COMMUNITY): Payer: Self-pay

## 2020-03-17 ENCOUNTER — Emergency Department (HOSPITAL_COMMUNITY)
Admission: EM | Admit: 2020-03-17 | Discharge: 2020-03-17 | Disposition: A | Discharge status: Home / Self Care | Payer: Medicaid - Out of State | Attending: Emergency Medicine | Admitting: Emergency Medicine

## 2020-03-17 ENCOUNTER — Other Ambulatory Visit: Payer: Self-pay

## 2020-03-17 DIAGNOSIS — F17210 Nicotine dependence, cigarettes, uncomplicated: Secondary | ICD-10-CM | POA: Diagnosis not present

## 2020-03-17 DIAGNOSIS — H66002 Acute suppurative otitis media without spontaneous rupture of ear drum, left ear: Secondary | ICD-10-CM

## 2020-03-17 DIAGNOSIS — H9202 Otalgia, left ear: Secondary | ICD-10-CM | POA: Diagnosis present

## 2020-03-17 MED ORDER — NAPROXEN 375 MG PO TABS: 375.0000 mg | ORAL_TABLET | Freq: Two times a day (BID) | ORAL | 0 refills | Status: DC

## 2020-03-17 MED ORDER — CEFDINIR 300 MG PO CAPS: 600.0000 mg | ORAL_CAPSULE | Freq: Every day | ORAL | 0 refills | Status: AC

## 2020-03-17 NOTE — Discharge Instructions (Signed)
Get help right away if: °You have severe pain that is not controlled with medicine. °You have swelling, redness, or pain around your ear. °You have stiffness in your neck. °A part of your face is paralyzed. °The bone behind your ear (mastoid) is tender when you touch it. °You develop a severe headache. °

## 2020-03-17 NOTE — ED Triage Notes (Signed)
Patient reports sore/numb throat and left ear ache and ears stopped up.   Patient was given antibiotics for same in June.   A/Ox4 Ambulatory in triage.

## 2020-03-17 NOTE — ED Provider Notes (Signed)
[facility]-EMERGENCY DEPT Provider Note   CSN: [id number] Arrival date & time: 03/17/20  1405     History Chief Complaint  Patient presents with  . Sore Throat  . Otalgia    Krystal Curry is a 39 y.o. female presents emergency department chief complaint of left ear pain radiating into her left throat and jaw.  She had onset of symptoms several days ago.  Symptoms have been worsening.  She states it hurts when she last lays on the left side of her face.  She denies any dental pain.  She denies fevers or chills.  She has decreased hearing on the left side and feel like her ear is "stopped up.  She has history of previous ear infections requiring antibiotic use.  HPI     Past Medical History:  Diagnosis Date  . Bone spur    "between my shoulder blades"    There are no problems to display for this patient.   Past Surgical History:  Procedure Laterality Date  . CHOLECYSTECTOMY    . KNEE SURGERY Left   . MULTIPLE TOOTH EXTRACTIONS Bilateral      OB History   No obstetric history on file.     Family History  Family history unknown: Yes    Social History   Tobacco Use  . Smoking status: Current Every Day Smoker    Packs/day: 0.35    Types: Cigarettes  . Smokeless tobacco: Never Used  Vaping Use  . Vaping Use: Never used  Substance Use Topics  . Alcohol use: Not Currently    Comment: socially  . Drug use: No    Home Medications Prior to Admission medications   Medication Sig Start Date End Date Taking? Authorizing Provider  cefdinir (OMNICEF) 300 MG capsule Take 2 capsules (600 mg total) by mouth daily for 5 days. 03/17/20 03/22/20  Arthor Captain, PA-C  clindamycin (CLEOCIN) 150 MG capsule Take 2 capsules (300 mg total) by mouth 3 (three) times daily. 03/10/18   Fisher, Roselyn Bering, PA-C  famotidine (PEPCID) 40 MG tablet Take 1 tablet (40 mg total) by mouth at bedtime. 10/08/17 10/08/18  Myrna Blazer, MD  gabapentin (NEURONTIN) 600  MG tablet Take 600 mg by mouth 3 (three) times daily.    [provider]  HYDROcodone-acetaminophen (NORCO/VICODIN) 5-325 MG tablet Take 1 tablet by mouth every 6 (six) hours as needed for moderate pain. 03/10/18   Fisher, Roselyn Bering, PA-C  naproxen (NAPROSYN) 375 MG tablet Take 1 tablet (375 mg total) by mouth 2 (two) times daily. 03/17/20   Arthor Captain, PA-C    Allergies    Codeine, Penicillins, Tizanidine, Penicillins, Prednisone, and Tylenol [acetaminophen]  Review of Systems   Review of Systems Ten systems reviewed and are negative for acute change, except as noted in the HPI.   Physical Exam Updated Vital Signs BP 120/71   Pulse 80   Temp 98.2 F (36.8 C) (Oral)   Resp 18   Ht 5\' 1"  (1.549 m)   Wt 58.1 kg   LMP 02/24/2020   SpO2 99%   BMI 24.19 kg/m   Physical Exam Physical Exam  Nursing note and vitals reviewed. Constitutional: She is oriented to person, place, and time. She appears well-developed and well-nourished. No distress.  HENT:  Head: Normocephalic and atraumatic.  Ears: Right TM normal, left TM bulging, angry and erythematous.  Canal normal.  No pain with pinna movement Mouth: Mild erythema without exudates, tonsillar swelling, uvula midline. Eyes:  Conjunctivae normal and EOM are normal. Pupils are equal, round, and reactive to light. No scleral icterus.  Neck: Normal range of motion.  Cardiovascular: Normal rate, regular rhythm and normal heart sounds.  Exam reveals no gallop and no friction rub.   No murmur heard. Pulmonary/Chest: Effort normal and breath sounds normal. No respiratory distress.  Abdominal: Soft. Bowel sounds are normal. She exhibits no distension and no mass. There is no tenderness. There is no guarding.  Neurological: She is alert and oriented to person, place, and time.  Skin: Skin is warm and dry. She is not diaphoretic.    ED Results / Procedures / Treatments   Labs (all labs ordered are listed, but only abnormal results are  displayed) Labs Reviewed - No data to display  EKG None  Radiology No results found.  Procedures Procedures (including critical care time)  Medications Ordered in ED Medications - No data to display  ED Course  I have reviewed the triage vital signs and the nursing notes.  Pertinent labs & imaging results that were available during my care of the patient were reviewed by me and considered in my medical decision making (see chart for details).    MDM Rules/Calculators/A&P                          39 year old female here with left ear pain.  She does appear to have an acute otitis media.  She is allergic to penicillin will be treated with cefdinir.  New signs or symptoms of meningitis.  She is afebrile and hemodynamically stable and appears appropriate for discharge with outpatient follow-up. Final Clinical Impression(s) / ED Diagnoses Final diagnoses:  Acute suppurative otitis media of left ear without spontaneous rupture of tympanic membrane, recurrence not specified    Rx / DC Orders ED Discharge Orders         Ordered    cefdinir (OMNICEF) 300 MG capsule  Daily        03/17/20 2001    naproxen (NAPROSYN) 375 MG tablet  2 times daily        03/17/20 2001           Arthor Captain, PA-C 03/18/20 2007    Sabas Sous, MD 03/18/20 2140

## 2020-03-17 NOTE — ED Notes (Signed)
An After Visit Summary was printed and given to the patient. Discharge instructions given and no further questions at this time.  

## 2020-03-23 ENCOUNTER — Emergency Department (HOSPITAL_COMMUNITY)
Admission: EM | Admit: 2020-03-23 | Discharge: 2020-03-24 | Discharge status: Against Medical Advice | Payer: Medicaid - Out of State

## 2020-03-23 ENCOUNTER — Other Ambulatory Visit: Payer: Self-pay

## 2020-03-23 NOTE — ED Notes (Signed)
Pt left saying she couldn't wait any longer

## 2020-07-30 ENCOUNTER — Emergency Department (HOSPITAL_COMMUNITY)
Admission: EM | Admit: 2020-07-30 | Discharge: 2020-07-30 | Disposition: A | Discharge status: Home / Self Care | Payer: Medicaid - Out of State | Attending: Emergency Medicine | Admitting: Emergency Medicine

## 2020-07-30 ENCOUNTER — Emergency Department (HOSPITAL_COMMUNITY): Payer: Medicaid - Out of State

## 2020-07-30 DIAGNOSIS — F17210 Nicotine dependence, cigarettes, uncomplicated: Secondary | ICD-10-CM | POA: Insufficient documentation

## 2020-07-30 DIAGNOSIS — R059 Cough, unspecified: Secondary | ICD-10-CM | POA: Diagnosis present

## 2020-07-30 DIAGNOSIS — Z20822 Contact with and (suspected) exposure to covid-19: Secondary | ICD-10-CM | POA: Insufficient documentation

## 2020-07-30 DIAGNOSIS — R030 Elevated blood-pressure reading, without diagnosis of hypertension: Secondary | ICD-10-CM | POA: Diagnosis not present

## 2020-07-30 LAB — SARS CORONAVIRUS 2 BY RT PCR (HOSPITAL ORDER, PERFORMED IN ~~LOC~~ HOSPITAL LAB): SARS Coronavirus 2: NEGATIVE

## 2020-07-30 NOTE — ED Triage Notes (Signed)
Pt reports productive cough x4 days. Pt reports she is fully vaccinated.

## 2020-07-30 NOTE — ED Provider Notes (Signed)
[facility] EMERGENCY DEPARTMENT Provider Note   CSN: [id number] Arrival date & time: 07/30/20  1321     History Chief Complaint  Patient presents with  . Cough    Krystal Curry is a 40 y.o. female.  The history is provided by the patient.  Cough Cough characteristics:  Non-productive Sputum characteristics:  Nondescript Severity:  Mild Onset quality:  Gradual Duration:  4 days Timing:  Intermittent Chronicity:  New Smoker: yes   Context: upper respiratory infection   Relieved by:  Nothing Worsened by:  Nothing Associated symptoms: no chest pain, no chills, no ear pain, no fever, no rash, no shortness of breath and no sore throat        Past Medical History:  Diagnosis Date  . Bone spur    "between my shoulder blades"    There are no problems to display for this patient.   Past Surgical History:  Procedure Laterality Date  . CHOLECYSTECTOMY    . KNEE SURGERY Left   . MULTIPLE TOOTH EXTRACTIONS Bilateral      OB History   No obstetric history on file.     Family History  Family history unknown: Yes    Social History   Tobacco Use  . Smoking status: Current Every Day Smoker    Packs/day: 0.35    Types: Cigarettes  . Smokeless tobacco: Never Used  Vaping Use  . Vaping Use: Never used  Substance Use Topics  . Alcohol use: Not Currently    Comment: socially  . Drug use: No    Home Medications Prior to Admission medications   Medication Sig Start Date End Date Taking? Authorizing Provider  clindamycin (CLEOCIN) 150 MG capsule Take 2 capsules (300 mg total) by mouth 3 (three) times daily. 03/10/18   Fisher, Roselyn Bering, PA-C  famotidine (PEPCID) 40 MG tablet Take 1 tablet (40 mg total) by mouth at bedtime. 10/08/17 10/08/18  Myrna Blazer, MD  gabapentin (NEURONTIN) 600 MG tablet Take 600 mg by mouth 3 (three) times daily.    [provider]  HYDROcodone-acetaminophen (NORCO/VICODIN) 5-325 MG tablet Take 1  tablet by mouth every 6 (six) hours as needed for moderate pain. 03/10/18   Fisher, Roselyn Bering, PA-C  naproxen (NAPROSYN) 375 MG tablet Take 1 tablet (375 mg total) by mouth 2 (two) times daily. 03/17/20   Arthor Captain, PA-C    Allergies    Codeine, Penicillins, Tizanidine, Penicillins, Prednisone, and Tylenol [acetaminophen]  Review of Systems   Review of Systems  Constitutional: Negative for chills and fever.  HENT: Negative for ear pain and sore throat.   Eyes: Negative for pain.  Respiratory: Positive for cough. Negative for shortness of breath.   Cardiovascular: Negative for chest pain and palpitations.  Gastrointestinal: Negative for abdominal pain and vomiting.  Musculoskeletal: Negative for arthralgias and back pain.  Skin: Negative for color change and rash.  All other systems reviewed and are negative.   Physical Exam Updated Vital Signs BP 131/79   Pulse 86   Temp 98.5 F (36.9 C) (Oral)   Resp 14   Ht 5\' 1"  (1.549 m)   Wt 60.8 kg   LMP 07/15/2020   SpO2 99%   BMI 25.32 kg/m   Physical Exam Vitals and nursing note reviewed.  Constitutional:      General: She is not in acute distress.    Appearance: She is well-developed and well-nourished. She is not ill-appearing.  HENT:     Mouth/Throat:  Mouth: Mucous membranes are moist.  Cardiovascular:     Rate and Rhythm: Normal rate and regular rhythm.     Pulses: Normal pulses.     Heart sounds: Normal heart sounds. No murmur heard.   Pulmonary:     Effort: Pulmonary effort is normal. No respiratory distress.     Breath sounds: Normal breath sounds.  Musculoskeletal:        General: No edema.  Skin:    General: Skin is warm and dry.  Neurological:     Mental Status: She is alert.  Psychiatric:        Mood and Affect: Mood and affect normal.     ED Results / Procedures / Treatments   Labs (all labs ordered are listed, but only abnormal results are displayed) Labs Reviewed  SARS CORONAVIRUS 2 BY RT  PCR (HOSPITAL ORDER, PERFORMED IN [facility] LAB)    EKG None  Radiology DG Chest 2 View  Result Date: 07/30/2020 CLINICAL DATA:  Productive cough x4 days. EXAM: CHEST - 2 VIEW COMPARISON:  January 09, 2020 FINDINGS: The heart size and mediastinal contours are within normal limits. Both lungs are clear. The visualized skeletal structures are unremarkable. Radiopaque surgical clips are seen within the right upper quadrant. IMPRESSION: No active cardiopulmonary disease. Electronically Signed   By: Aram Candela M.D.   On: 07/30/2020 17:17    Procedures Procedures (including critical care time)  Medications Ordered in ED Medications - No data to display  ED Course  I have reviewed the triage vital signs and the nursing notes.  Pertinent labs & imaging results that were available during my care of the patient were reviewed by me and considered in my medical decision making (see chart for details).    MDM Rules/Calculators/A&P                          SHAUNTE TUFT is here with cough.  Vaccinated against COVID.  COVID test is negative.  Chest x-ray no signs of infection.  Normal vitals.  No fever.  Overall suspect viral process.  Close contacts with similar symptoms.  Given reassurance and discharged in ED in good condition.  This chart was dictated using voice recognition software.  Despite best efforts to proofread,  errors can occur which can change the documentation meaning.  Krystal Curry was evaluated in Emergency Department on 07/30/2020 for the symptoms described in the history of present illness. She was evaluated in the context of the global COVID-19 pandemic, which necessitated consideration that the patient might be at risk for infection with the SARS-CoV-2 virus that causes COVID-19. Institutional protocols and algorithms that pertain to the evaluation of patients at risk for COVID-19 are in a state of rapid change based on information released by regulatory  bodies including the CDC and federal and state organizations. These policies and algorithms were followed during the patient's care in the ED.   Final Clinical Impression(s) / ED Diagnoses Final diagnoses:  Cough    Rx / DC Orders ED Discharge Orders    None       Virgina Norfolk, DO 07/30/20 2109

## 2020-09-22 ENCOUNTER — Encounter (HOSPITAL_COMMUNITY): Payer: Self-pay

## 2020-09-22 ENCOUNTER — Other Ambulatory Visit: Payer: Self-pay

## 2020-09-22 ENCOUNTER — Emergency Department (HOSPITAL_COMMUNITY): Payer: Medicaid - Out of State

## 2020-09-22 ENCOUNTER — Emergency Department (HOSPITAL_COMMUNITY)
Admission: EM | Admit: 2020-09-22 | Discharge: 2020-09-22 | Disposition: A | Discharge status: Home / Self Care | Payer: Medicaid - Out of State | Attending: Emergency Medicine | Admitting: Emergency Medicine

## 2020-09-22 DIAGNOSIS — R1010 Upper abdominal pain, unspecified: Secondary | ICD-10-CM | POA: Diagnosis not present

## 2020-09-22 DIAGNOSIS — R1084 Generalized abdominal pain: Secondary | ICD-10-CM | POA: Diagnosis not present

## 2020-09-22 DIAGNOSIS — R21 Rash and other nonspecific skin eruption: Secondary | ICD-10-CM | POA: Insufficient documentation

## 2020-09-22 DIAGNOSIS — F17210 Nicotine dependence, cigarettes, uncomplicated: Secondary | ICD-10-CM | POA: Diagnosis not present

## 2020-09-22 HISTORY — DX: Zoster without complications: B02.9

## 2020-09-22 LAB — COMPREHENSIVE METABOLIC PANEL
ALT: 55 U/L — ABNORMAL HIGH (ref 0–44)
AST: 57 U/L — ABNORMAL HIGH (ref 15–41)
Albumin: 4.4 g/dL (ref 3.5–5.0)
Alkaline Phosphatase: 49 U/L (ref 38–126)
Anion gap: 9 (ref 5–15)
BUN: 17 mg/dL (ref 6–20)
CO2: 21 mmol/L — ABNORMAL LOW (ref 22–32)
Calcium: 9.1 mg/dL (ref 8.9–10.3)
Chloride: 104 mmol/L (ref 98–111)
Creatinine, Ser: 0.7 mg/dL (ref 0.44–1.00)
GFR, Estimated: >60 mL/min (ref >60)
Glucose, Bld: 116 mg/dL — ABNORMAL HIGH (ref 70–99)
Potassium: 3.7 mmol/L (ref 3.5–5.1)
Sodium: 134 mmol/L — ABNORMAL LOW (ref 135–145)
Total Bilirubin: 0.4 mg/dL (ref 0.3–1.2)
Total Protein: 7.7 g/dL (ref 6.5–8.1)

## 2020-09-22 LAB — URINALYSIS, ROUTINE W REFLEX MICROSCOPIC
Bilirubin Urine: NEGATIVE
Glucose, UA: NEGATIVE mg/dL
Ketones, ur: NEGATIVE mg/dL
Leukocytes,Ua: NEGATIVE
Nitrite: NEGATIVE
Protein, ur: NEGATIVE mg/dL
Specific Gravity, Urine: 1.023 (ref 1.005–1.030)
pH: 5 (ref 5.0–8.0)

## 2020-09-22 LAB — CBC
HCT: 43.7 % (ref 36.0–46.0)
Hemoglobin: 13.9 g/dL (ref 12.0–15.0)
MCH: 27.4 pg (ref 26.0–34.0)
MCHC: 31.8 g/dL (ref 30.0–36.0)
MCV: 86.2 fL (ref 80.0–100.0)
Platelets: 327 10*3/uL (ref 150–400)
RBC: 5.07 MIL/uL (ref 3.87–5.11)
RDW: 14.7 % (ref 11.5–15.5)
WBC: 9.5 10*3/uL (ref 4.0–10.5)
nRBC: 0 % (ref 0.0–0.2)

## 2020-09-22 LAB — LIPASE, BLOOD: Lipase: 39 U/L (ref 11–51)

## 2020-09-22 LAB — I-STAT BETA HCG BLOOD, ED (MC, WL, AP ONLY): I-stat hCG, quantitative: <5 m[IU]/mL (ref <5)

## 2020-09-22 MED ORDER — OXYCODONE-ACETAMINOPHEN 5-325 MG PO TABS: 1.0000 | ORAL_TABLET | Freq: Four times a day (QID) | ORAL | 0 refills | Status: DC | PRN

## 2020-09-22 MED ORDER — HYDROCODONE-ACETAMINOPHEN 5-325 MG PO TABS: 1.0000 | ORAL_TABLET | Freq: Four times a day (QID) | ORAL | 0 refills | Status: DC | PRN

## 2020-09-22 MED ORDER — HYDROMORPHONE HCL 1 MG/ML IJ SOLN
1.0000 mg | Freq: Once | INTRAMUSCULAR | Status: AC
  Administered 2020-09-22: 1 mg via INTRAVENOUS
  Filled 2020-09-22: qty 1

## 2020-09-22 MED ORDER — ONDANSETRON HCL 4 MG/2ML IJ SOLN
4.0000 mg | Freq: Once | INTRAMUSCULAR | Status: AC
  Administered 2020-09-22: 4 mg via INTRAVENOUS
  Filled 2020-09-22: qty 2

## 2020-09-22 MED ORDER — VALACYCLOVIR HCL 1 G PO TABS: 1000.0000 mg | ORAL_TABLET | Freq: Three times a day (TID) | ORAL | 0 refills | Status: DC

## 2020-09-22 MED ORDER — NICOTINE 21 MG/24HR TD PT24
21.0000 mg | MEDICATED_PATCH | Freq: Once | TRANSDERMAL | Status: DC
  Administered 2020-09-22: 21 mg via TRANSDERMAL
  Filled 2020-09-22: qty 1

## 2020-09-22 MED ORDER — ONDANSETRON 4 MG PO TBDP: ORAL_TABLET | ORAL | 0 refills | Status: DC

## 2020-09-22 NOTE — ED Triage Notes (Signed)
Patient c/o lower abdominal pain x 1 week. Patient also c/o nausea and loose stools. patient has a rash to her left face x 3 days.

## 2020-09-22 NOTE — Discharge Instructions (Signed)
Drink plenty of fluids.  Follow-up with your family doctor next week.  Return if any problem 

## 2020-09-22 NOTE — ED Provider Notes (Signed)
[facility]-EMERGENCY DEPT Provider Note   CSN: [id number] Arrival date & time: 09/22/20  0741     History Chief Complaint  Patient presents with  . Abdominal Pain  . Nausea  . Rash  . Fatigue    Krystal Curry is a 40 y.o. female.  Patient complains of rash to face.  She has had shingles before in the same place.  She also has been having some abdominal cramping with some loose stools.  No fever no chills  The history is provided by the patient and medical records. No language interpreter was used.  Abdominal Pain Pain location:  Generalized Pain quality: aching   Pain radiates to:  Does not radiate Pain severity:  Mild Onset quality:  Unable to specify Timing:  Constant Progression:  Waxing and waning Chronicity:  New Context: not alcohol use   Relieved by:  Nothing Associated symptoms: no chest pain, no cough, no diarrhea, no fatigue and no hematuria   Rash Associated symptoms: abdominal pain   Associated symptoms: no diarrhea, no fatigue and no headaches        Past Medical History:  Diagnosis Date  . Bone spur    "between my shoulder blades"  . Shingles     There are no problems to display for this patient.   Past Surgical History:  Procedure Laterality Date  . CHOLECYSTECTOMY    . KNEE SURGERY Left   . MULTIPLE TOOTH EXTRACTIONS Bilateral      OB History   No obstetric history on file.     Family History  Family history unknown: Yes    Social History   Tobacco Use  . Smoking status: Current Every Day Smoker    Packs/day: 0.15    Types: Cigarettes  . Smokeless tobacco: Never Used  Vaping Use  . Vaping Use: Never used  Substance Use Topics  . Alcohol use: Not Currently    Comment: socially  . Drug use: No    Home Medications Prior to Admission medications   Medication Sig Start Date End Date Taking? Authorizing Provider  HYDROcodone-acetaminophen (NORCO/VICODIN) 5-325 MG tablet Take 1 tablet by mouth every  6 (six) hours as needed for moderate pain. 09/22/20  Yes Bethann Berkshire, MD  ondansetron (ZOFRAN ODT) 4 MG disintegrating tablet 4mg  ODT q4 hours prn nausea/vomit 09/22/20  Yes 09/24/20, MD  valACYclovir (VALTREX) 1000 MG tablet Take 1 tablet (1,000 mg total) by mouth 3 (three) times daily. 09/22/20  Yes 09/24/20, MD  clindamycin (CLEOCIN) 150 MG capsule Take 2 capsules (300 mg total) by mouth 3 (three) times daily. 03/10/18   Fisher, 03/12/18, PA-C  famotidine (PEPCID) 40 MG tablet Take 1 tablet (40 mg total) by mouth at bedtime. 10/08/17 10/08/18  10/10/18, MD  gabapentin (NEURONTIN) 600 MG tablet Take 600 mg by mouth 3 (three) times daily.    [provider]  naproxen (NAPROSYN) 375 MG tablet Take 1 tablet (375 mg total) by mouth 2 (two) times daily. 03/17/20   05/17/20, PA-C    Allergies    Codeine, Penicillins, Tizanidine, Penicillins, Prednisone, and Tylenol [acetaminophen]  Review of Systems   Review of Systems  Constitutional: Negative for appetite change and fatigue.  HENT: Negative for congestion, ear discharge and sinus pressure.   Eyes: Negative for discharge.  Respiratory: Negative for cough.   Cardiovascular: Negative for chest pain.  Gastrointestinal: Positive for abdominal pain. Negative for diarrhea.  Genitourinary: Negative for frequency and hematuria.  Musculoskeletal: Negative for back pain.  Skin: Positive for rash.  Neurological: Negative for seizures and headaches.  Psychiatric/Behavioral: Negative for hallucinations.    Physical Exam Updated Vital Signs BP 135/76   Pulse 85   Temp 98.1 F (36.7 C) (Oral)   Resp 12   Ht 5\' 1"  (1.549 m)   Wt 62.1 kg   LMP 09/10/2020   SpO2 95%   BMI 25.89 kg/m   Physical Exam Vitals and nursing note reviewed.  Constitutional:      Appearance: She is well-developed.  HENT:     Head: Normocephalic.     Nose: Nose normal.  Eyes:     General: No scleral icterus.     Conjunctiva/sclera: Conjunctivae normal.  Neck:     Thyroid: No thyromegaly.  Cardiovascular:     Rate and Rhythm: Normal rate and regular rhythm.     Heart sounds: No murmur heard. No friction rub. No gallop.   Pulmonary:     Breath sounds: No stridor. No wheezing or rales.  Chest:     Chest wall: No tenderness.  Abdominal:     General: There is no distension.     Tenderness: There is abdominal tenderness. There is no rebound.  Musculoskeletal:        General: Normal range of motion.     Cervical back: Neck supple.  Lymphadenopathy:     Cervical: No cervical adenopathy.  Skin:    Findings: No erythema or rash.  Neurological:     Mental Status: She is alert and oriented to person, place, and time.     Motor: No abnormal muscle tone.     Coordination: Coordination normal.  Psychiatric:        Behavior: Behavior normal.     ED Results / Procedures / Treatments   Labs (all labs ordered are listed, but only abnormal results are displayed) Labs Reviewed  COMPREHENSIVE METABOLIC PANEL - Abnormal; Notable for the following components:      Result Value   Sodium 134 (*)    CO2 21 (*)    Glucose, Bld 116 (*)    AST 57 (*)    ALT 55 (*)    All other components within normal limits  URINALYSIS, ROUTINE W REFLEX MICROSCOPIC - Abnormal; Notable for the following components:   Hgb urine dipstick MODERATE (*)    Bacteria, UA RARE (*)    All other components within normal limits  LIPASE, BLOOD  CBC  I-STAT BETA HCG BLOOD, ED ([facility], [facility], [facility] ONLY)    EKG None  Radiology DG ABD ACUTE 2+V W 1V CHEST  Result Date: 09/22/2020 CLINICAL DATA:  Lower abdominal pain for 1 week. EXAM: DG ABDOMEN ACUTE WITH 1 VIEW CHEST COMPARISON:  Chest radiograph 07/30/2020 FINDINGS: Cholecystectomy clips noted. There is no evidence of dilated bowel loops or free intraperitoneal air. No radiopaque calculi or other significant radiographic abnormality is seen. Heart size and mediastinal contours are  within normal limits. Both lungs are clear. IMPRESSION: Negative abdominal radiographs.  No acute cardiopulmonary disease. Electronically Signed   By: 08/01/2020 M.D.   On: 09/22/2020 10:27    Procedures Procedures   Medications Ordered in ED Medications  nicotine (NICODERM CQ - dosed in mg/24 hours) patch 21 mg (21 mg Transdermal Patch Applied 09/22/20 1054)  HYDROmorphone (DILAUDID) injection 1 mg (1 mg Intravenous Given 09/22/20 1042)  ondansetron (ZOFRAN) injection 4 mg (4 mg Intravenous Given 09/22/20 1039)    ED Course  I have reviewed  the triage vital signs and the nursing notes.  Pertinent labs & imaging results that were available during my care of the patient were reviewed by me and considered in my medical decision making (see chart for details). Patient with normal labs.  Possible shingles.  She will be treated with Valtrex.  Patient also given some Zofran for abdominal nausea and will follow up with her PCP   MDM Rules/Calculators/A&P                         Possible shingles with abdominal cramping.  Final Clinical Impression(s) / ED Diagnoses Final diagnoses:  Pain of upper abdomen  Rash    Rx / DC Orders ED Discharge Orders         Ordered    HYDROcodone-acetaminophen (NORCO/VICODIN) 5-325 MG tablet  Every 6 hours PRN        09/22/20 1203    ondansetron (ZOFRAN ODT) 4 MG disintegrating tablet        09/22/20 1203    valACYclovir (VALTREX) 1000 MG tablet  3 times daily        09/22/20 1203           Bethann Berkshire, MD 09/22/20 1209

## 2020-09-22 NOTE — ED Notes (Signed)
Patient transported to X-ray 

## 2020-09-22 NOTE — ED Notes (Signed)
Patient back from x-ray 

## 2020-09-28 ENCOUNTER — Other Ambulatory Visit: Payer: Self-pay

## 2020-09-28 ENCOUNTER — Emergency Department (HOSPITAL_COMMUNITY)
Admission: EM | Admit: 2020-09-28 | Discharge: 2020-09-28 | Disposition: A | Discharge status: Home / Self Care | Payer: Medicaid - Out of State | Attending: Emergency Medicine | Admitting: Emergency Medicine

## 2020-09-28 ENCOUNTER — Encounter (HOSPITAL_COMMUNITY): Payer: Self-pay | Admitting: Emergency Medicine

## 2020-09-28 DIAGNOSIS — L299 Pruritus, unspecified: Secondary | ICD-10-CM | POA: Insufficient documentation

## 2020-09-28 DIAGNOSIS — Z760 Encounter for issue of repeat prescription: Secondary | ICD-10-CM | POA: Insufficient documentation

## 2020-09-28 DIAGNOSIS — F17210 Nicotine dependence, cigarettes, uncomplicated: Secondary | ICD-10-CM | POA: Insufficient documentation

## 2020-09-28 DIAGNOSIS — R21 Rash and other nonspecific skin eruption: Secondary | ICD-10-CM | POA: Insufficient documentation

## 2020-09-28 LAB — COMPREHENSIVE METABOLIC PANEL
ALT: 66 U/L — ABNORMAL HIGH (ref 0–44)
AST: 58 U/L — ABNORMAL HIGH (ref 15–41)
Albumin: 4.4 g/dL (ref 3.5–5.0)
Alkaline Phosphatase: 52 U/L (ref 38–126)
Anion gap: 10 (ref 5–15)
BUN: 12 mg/dL (ref 6–20)
CO2: 21 mmol/L — ABNORMAL LOW (ref 22–32)
Calcium: 9.4 mg/dL (ref 8.9–10.3)
Chloride: 106 mmol/L (ref 98–111)
Creatinine, Ser: 0.75 mg/dL (ref 0.44–1.00)
GFR, Estimated: >60 mL/min (ref >60)
Glucose, Bld: 102 mg/dL — ABNORMAL HIGH (ref 70–99)
Potassium: 4 mmol/L (ref 3.5–5.1)
Sodium: 137 mmol/L (ref 135–145)
Total Bilirubin: 0.3 mg/dL (ref 0.3–1.2)
Total Protein: 8.3 g/dL — ABNORMAL HIGH (ref 6.5–8.1)

## 2020-09-28 LAB — CBC WITH DIFFERENTIAL/PLATELET
Abs Immature Granulocytes: 0.05 10*3/uL (ref 0.00–0.07)
Basophils Absolute: 0 10*3/uL (ref 0.0–0.1)
Basophils Relative: 0 %
Eosinophils Absolute: 0.2 10*3/uL (ref 0.0–0.5)
Eosinophils Relative: 2 %
HCT: 42 % (ref 36.0–46.0)
Hemoglobin: 13.3 g/dL (ref 12.0–15.0)
Immature Granulocytes: 0 %
Lymphocytes Relative: 31 %
Lymphs Abs: 4 10*3/uL (ref 0.7–4.0)
MCH: 27 pg (ref 26.0–34.0)
MCHC: 31.7 g/dL (ref 30.0–36.0)
MCV: 85.4 fL (ref 80.0–100.0)
Monocytes Absolute: 0.8 10*3/uL (ref 0.1–1.0)
Monocytes Relative: 6 %
Neutro Abs: 7.6 10*3/uL (ref 1.7–7.7)
Neutrophils Relative %: 61 %
Platelets: 334 10*3/uL (ref 150–400)
RBC: 4.92 MIL/uL (ref 3.87–5.11)
RDW: 15 % (ref 11.5–15.5)
WBC: 12.7 10*3/uL — ABNORMAL HIGH (ref 4.0–10.5)
nRBC: 0 % (ref 0.0–0.2)

## 2020-09-28 MED ORDER — ACYCLOVIR 800 MG PO TABS: 800.0000 mg | ORAL_TABLET | Freq: Every day | ORAL | 0 refills | Status: AC

## 2020-09-28 MED ORDER — SENNOSIDES-DOCUSATE SODIUM 8.6-50 MG PO TABS: 1.0000 | ORAL_TABLET | Freq: Every evening | ORAL | 0 refills | Status: AC | PRN

## 2020-09-28 MED ORDER — OXYCODONE-ACETAMINOPHEN 5-325 MG PO TABS
1.0000 | ORAL_TABLET | Freq: Once | ORAL | Status: AC
  Administered 2020-09-28: 1 via ORAL
  Filled 2020-09-28: qty 1

## 2020-09-28 MED ORDER — OXYCODONE-ACETAMINOPHEN 5-325 MG PO TABS: 1.0000 | ORAL_TABLET | Freq: Four times a day (QID) | ORAL | 0 refills | Status: AC | PRN

## 2020-09-28 MED ORDER — SODIUM CHLORIDE 0.9 % IV BOLUS
1000.0000 mL | Freq: Once | INTRAVENOUS | Status: AC
  Administered 2020-09-28: 1000 mL via INTRAVENOUS

## 2020-09-28 NOTE — ED Triage Notes (Signed)
The patient was seen 09/22/20 for a rash and was prescribed an antibiotic. She started the course of medication on the 16 th. On the 17 th she noticed she was shaky, agitated and confused. She stopped taking the medication and felt much better. She believes she had a reaction to the medication and is requesting to switch to a different antibiotic. The patient also reports the rash is spreading again.

## 2020-09-28 NOTE — Discharge Instructions (Signed)
You were seen in the emerge department today with a rash and jaw pain.  You have a follow-up with [facility] for your jaw and vascular issue there.  Please keep that appointment.  I am changing your antiviral medication of also called in a short prescription for pain medications.  Return to the emergency department any new or suddenly worsening symptoms.

## 2020-09-28 NOTE — ED Provider Notes (Signed)
Emergency Department Provider Note   I have reviewed the triage vital signs and the nursing notes.   HISTORY  Chief Complaint Rash and Medication Reaction   HPI ILITHYIA TITZER is a 40 y.o. female with past medical history reviewed below including recent ED visit on 3/15 presents to the emergency department with continued rash and pain to the left jaw/cheek area.  Patient was seen in the emergency department on the 15th with some abdominal pain, diarrhea, development of rash.  The area does not involve the eye and the patient is not having eye pain, irritation, vision changes.  She denies severe headaches or fevers.  No mouth lesions.  She was started on Valtrex but notes he had some intolerance to this medication including agitation type symptoms and so discontinued it.  She had been taking her Vicodin as well as Zofran but with pain medication running out she continues to have pain in the area.  Her abdominal pain diarrhea symptoms have resolved.  She has since noticed areas of rash on the right side of the neck and shoulder as well as right leg. The areas in these locations are not painful and are mildly itchy.   Patient tells me that she has had this jaw type pain in the past and states that it is related some abnormal vasculature on the left from imaging at another hospital. She is due to follow up with Dr. Artis Flock at [facility] for evaluation and treatment on April 1st.   Past Medical History:  Diagnosis Date  . Bone spur    "between my shoulder blades"  . Shingles     There are no problems to display for this patient.   Past Surgical History:  Procedure Laterality Date  . CHOLECYSTECTOMY    . KNEE SURGERY Left   . MULTIPLE TOOTH EXTRACTIONS Bilateral     Allergies Codeine, Penicillins, Tizanidine, Penicillins, Prednisone, and Tylenol [acetaminophen]  Family History  Family history unknown: Yes    Social History Social History   Tobacco Use  . Smoking status: Current  Every Day Smoker    Packs/day: 0.15    Types: Cigarettes  . Smokeless tobacco: Never Used  Vaping Use  . Vaping Use: Never used  Substance Use Topics  . Alcohol use: Not Currently    Comment: socially  . Drug use: No    Review of Systems  Constitutional: No fever/chills Eyes: No visual changes. ENT: No sore throat. Left jaw/face pain.  Cardiovascular: Denies chest pain. Respiratory: Denies shortness of breath. Gastrointestinal: No abdominal pain.  No nausea, no vomiting.  No diarrhea.  No constipation. Genitourinary: Negative for dysuria. Musculoskeletal: Negative for back pain. Skin: Positive rash.  Neurological: Negative for headaches, focal weakness or numbness.  10-point ROS otherwise negative.  ____________________________________________   PHYSICAL EXAM:  VITAL SIGNS: ED Triage Vitals  Enc Vitals Group     BP 09/28/20 1239 (!) 165/99     Pulse Rate 09/28/20 1239 (!) 110     Resp 09/28/20 1239 18     Temp 09/28/20 1238 98.6 F (37 C)     Temp Source 09/28/20 1238 Oral     SpO2 09/28/20 1239 99 %   Constitutional: Alert and oriented. Well appearing and in no acute distress. Eyes: Conjunctivae are normal. PERRL. EOMI. Head: Atraumatic. Ears:  Healthy appearing ear canals and TMs bilaterally Nose: No congestion/rhinnorhea. Mouth/Throat: Mucous membranes are moist.  Oropharynx non-erythematous. No lesions over the mucosal surface of the mouth or under the  tongue. No PTA. No trismus. Widely patent oropharynx.  Neck: No stridor. Cardiovascular: Normal rate, regular rhythm. Good peripheral circulation. Grossly normal heart sounds.   Respiratory: Normal respiratory effort.  No retractions. Lungs CTAB. Gastrointestinal: Soft and nontender. No distention.  Musculoskeletal: No lower extremity tenderness nor edema. No gross deformities of extremities. Neurologic:  Normal speech and language. No gross focal neurologic deficits are appreciated.  Skin:  Skin is warm, dry  and intact. No rash noted.  ____________________________________________   LABS (all labs ordered are listed, but only abnormal results are displayed)  Labs Reviewed  COMPREHENSIVE METABOLIC PANEL - Abnormal; Notable for the following components:      Result Value   CO2 21 (*)    Glucose, Bld 102 (*)    Total Protein 8.3 (*)    AST 58 (*)    ALT 66 (*)    All other components within normal limits  CBC WITH DIFFERENTIAL/PLATELET - Abnormal; Notable for the following components:   WBC 12.7 (*)    All other components within normal limits   ____________________________________________   PROCEDURES  Procedure(s) performed:   Procedures  None ____________________________________________   INITIAL IMPRESSION / ASSESSMENT AND PLAN / ED COURSE  Pertinent labs & imaging results that were available during my care of the patient were reviewed by me and considered in my medical decision making (see chart for details).   Patient presents to the emergency department with worsening rash to the face but now right leg.  The rash on the face is not blistering or crusting as I would expect with zoster.  Is now showing up and other areas of the body.  Patient has stopped taking the Valtrex with an intolerance there.  She does not have mucosal surface lesions where I would suspect Stevens-Johnson or other skin emergency.  Plan for screening blood work and pain control here.  The pain in the left jaw area the patient has had since imaging at an outside hospital showed some venous dilation and asymmetry.  She has follow-up at [facility] for this on April 1.  She is not having jaw claudication or temporal artery tenderness.  She is no trismus. I do not see an indication for advanced imaging of the head/face area. I have limited records from outside hospital and only telephone notes from [facility] available to me as they have not seen her as a patient yet.   Labs reviewed. Pain improved on re-evaluation.  After review of the [place] drug database I will provide a small Rx for pain at home along with switching her antiviral meds. Low suspicion that this is Zoster. No eye involvement or vision change. No AMS or other findings to suspect disseminated disease. Patient has f/u at [facility] in the coming weeks to deal with the presumed cause of her neck/face pain. Discussed ED return precautions.  ____________________________________________  FINAL CLINICAL IMPRESSION(S) / ED DIAGNOSES  Final diagnoses:  Rash     MEDICATIONS GIVEN DURING THIS VISIT:  Medications  oxyCODONE-acetaminophen (PERCOCET/ROXICET) 5-325 MG per tablet 1 tablet (1 tablet Oral Given 09/28/20 1345)  sodium chloride 0.9 % bolus 1,000 mL (0 mLs Intravenous Stopped 09/28/20 1605)     NEW OUTPATIENT MEDICATIONS STARTED DURING THIS VISIT:  Discharge Medication List as of 09/28/2020  3:45 PM    START taking these medications   Details  acyclovir (ZOVIRAX) 800 MG tablet Take 1 tablet (800 mg total) by mouth 5 (five) times daily for 7 days., Starting Mon 09/28/2020, Until Mon  10/05/2020, Normal    senna-docusate (SENOKOT-S) 8.6-50 MG tablet Take 1 tablet by mouth at bedtime as needed for mild constipation or moderate constipation., Starting Mon 09/28/2020, Normal        Note:  This document was prepared using Dragon voice recognition software and may include unintentional dictation errors.  Alona Bene, MD, [facility]    Aimie Wagman, Arlyss Repress, MD 09/29/20 1352

## 2022-01-31 ENCOUNTER — Telehealth: Payer: Self-pay | Admitting: *Deleted

## 2022-01-31 NOTE — Telephone Encounter (Signed)
Attempted to call patient to reschedule [facility] appointment. Phone number in Epic is not patients correct phone number. Will have letter mailed to patient.

## 2022-02-09 ENCOUNTER — Ambulatory Visit: Payer: Medicaid - Out of State

## 2022-02-09 ENCOUNTER — Other Ambulatory Visit: Payer: Self-pay

## 2022-02-09 DIAGNOSIS — Z1231 Encounter for screening mammogram for malignant neoplasm of breast: Secondary | ICD-10-CM

## 2022-02-11 ENCOUNTER — Ambulatory Visit: Payer: Medicaid - Out of State | Attending: Family Medicine

## 2022-02-11 ENCOUNTER — Inpatient Hospital Stay: Admission: RE | Admit: 2022-02-11 | Payer: Medicaid - Out of State | Source: Ambulatory Visit

## 2022-07-24 IMAGING — CR DG CHEST 2V
2 series · 2 of 2 positions shown · non-contrast
Comparison: January 09, 2020

CLINICAL DATA: Productive cough x4 days.

EXAM:
CHEST - 2 VIEW

[chest pa]
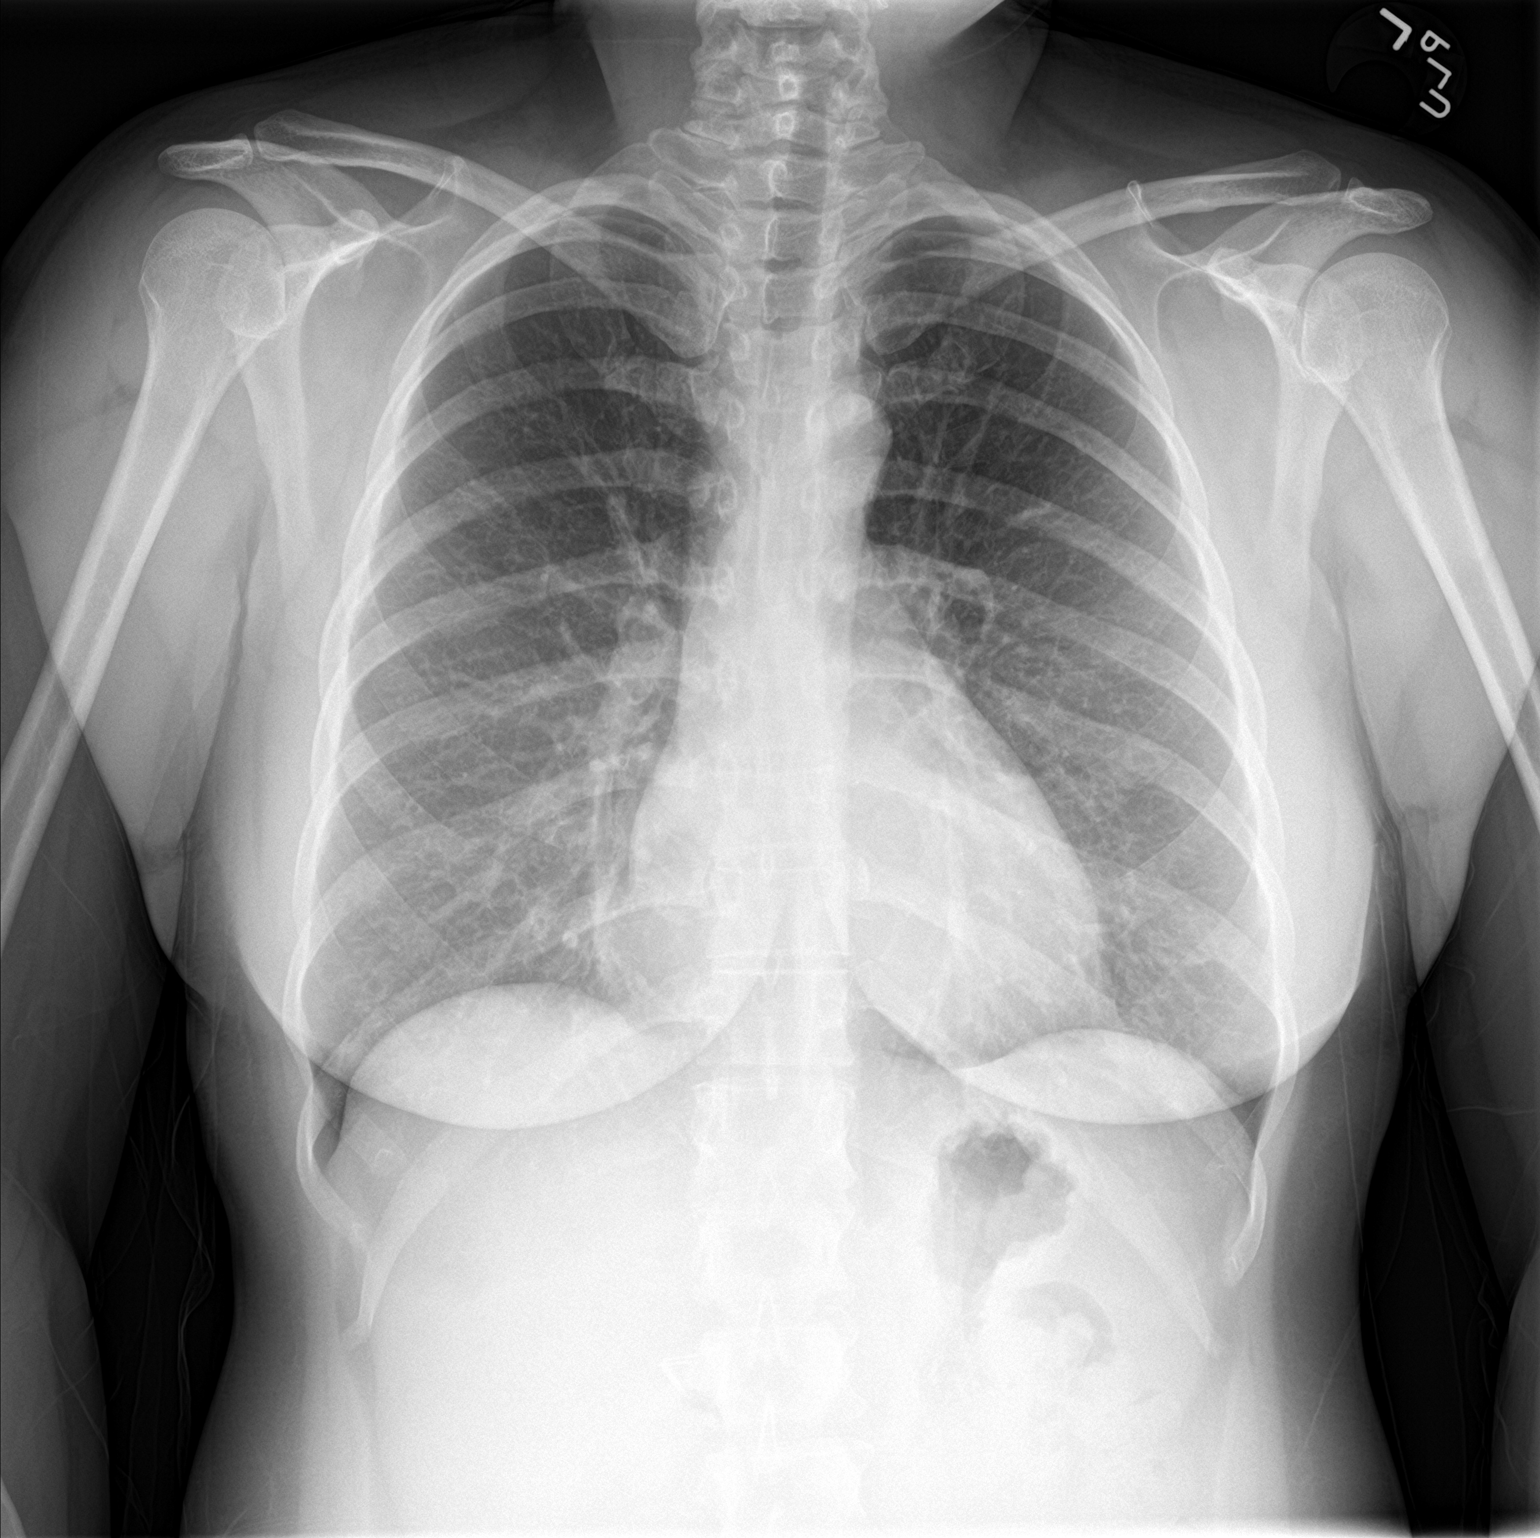

[chest lat]
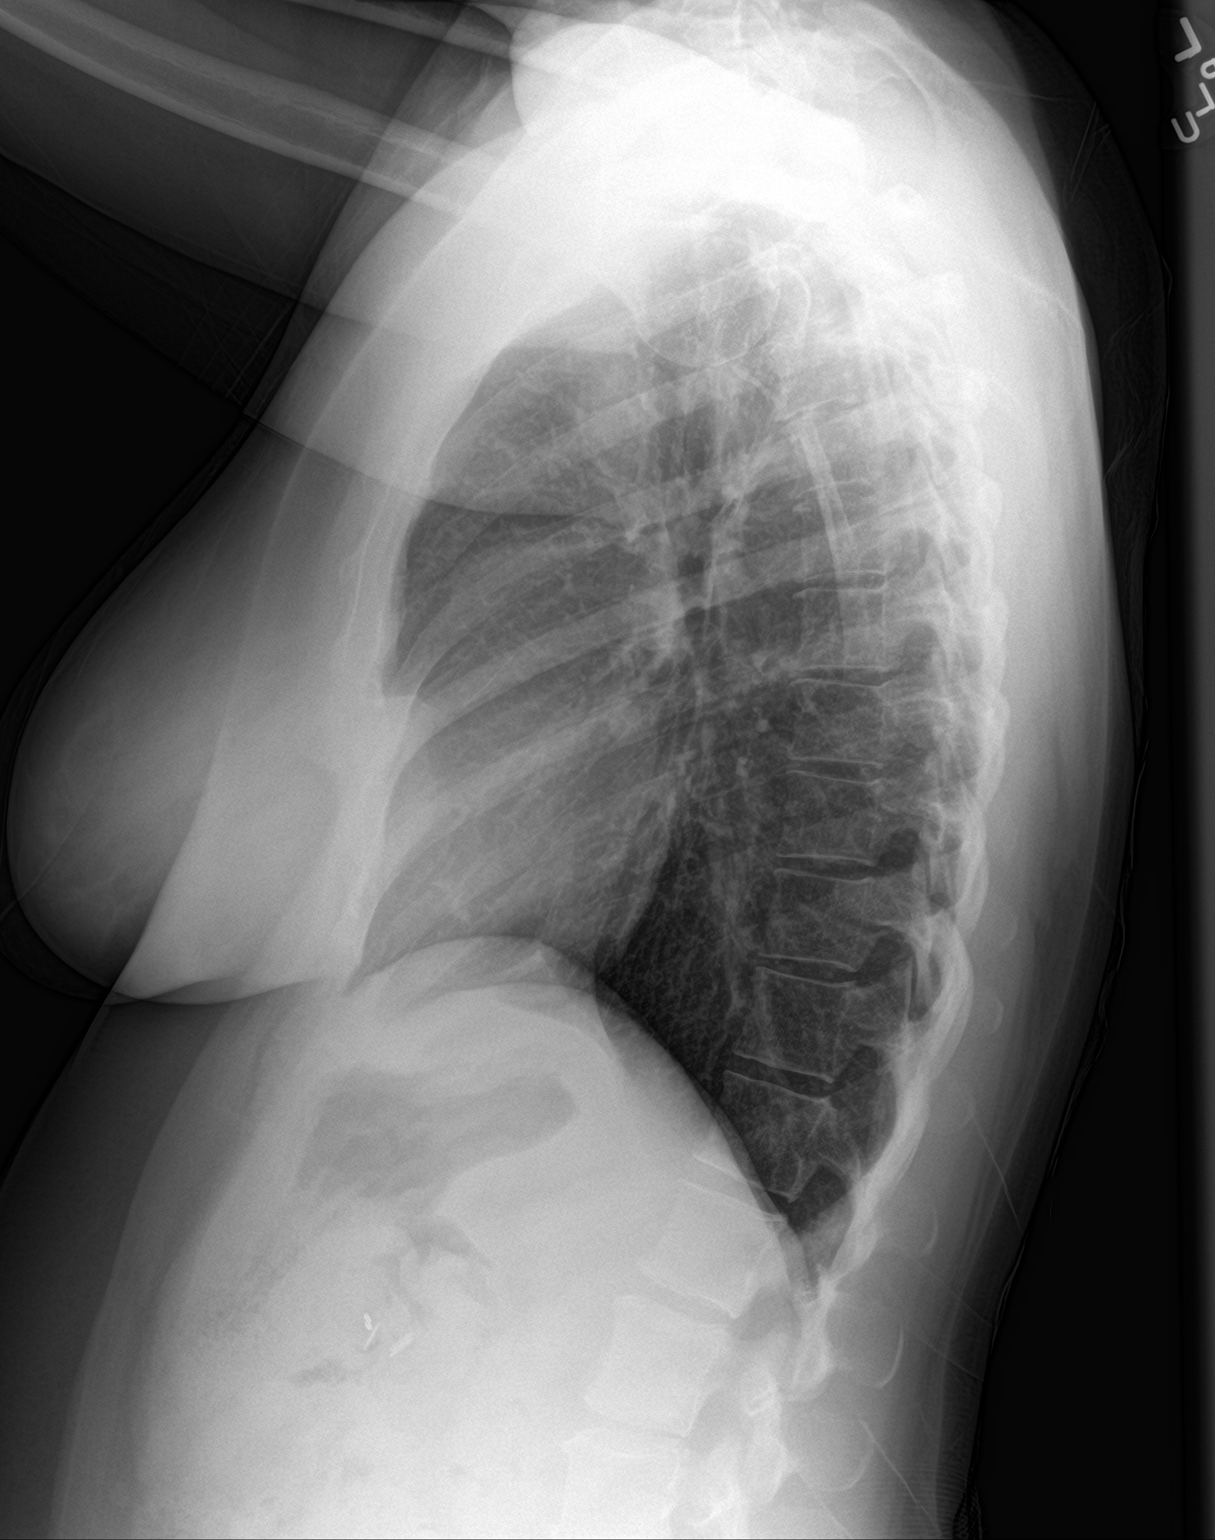

[2 of 2 positions shown; findings below may reference images not displayed]

FINDINGS: The heart size and mediastinal contours are within normal limits.
Both lungs are clear. The visualized skeletal structures are
unremarkable. Radiopaque surgical clips are seen within the right
upper quadrant.
IMPRESSION: No active cardiopulmonary disease.

## 2022-10-25 ENCOUNTER — Encounter (HOSPITAL_BASED_OUTPATIENT_CLINIC_OR_DEPARTMENT_OTHER): Payer: Self-pay

## 2022-10-25 ENCOUNTER — Emergency Department (HOSPITAL_BASED_OUTPATIENT_CLINIC_OR_DEPARTMENT_OTHER)
Admission: EM | Admit: 2022-10-25 | Discharge: 2022-10-26 | Disposition: A | Discharge status: Home / Self Care | Payer: Self-pay | Attending: Emergency Medicine | Admitting: Emergency Medicine

## 2022-10-25 ENCOUNTER — Other Ambulatory Visit: Payer: Self-pay

## 2022-10-25 DIAGNOSIS — R519 Headache, unspecified: Secondary | ICD-10-CM | POA: Insufficient documentation

## 2022-10-25 LAB — CBC
HCT: 41.3 % (ref 36.0–46.0)
Hemoglobin: 13.4 g/dL (ref 12.0–15.0)
MCH: 27.4 pg (ref 26.0–34.0)
MCHC: 32.4 g/dL (ref 30.0–36.0)
MCV: 84.5 fL (ref 80.0–100.0)
Platelets: 325 10*3/uL (ref 150–400)
RBC: 4.89 MIL/uL (ref 3.87–5.11)
RDW: 14.6 % (ref 11.5–15.5)
WBC: 11.6 10*3/uL — ABNORMAL HIGH (ref 4.0–10.5)
nRBC: 0 % (ref 0.0–0.2)

## 2022-10-25 MED ORDER — HYDROCODONE-ACETAMINOPHEN 5-325 MG PO TABS
1.0000 | ORAL_TABLET | Freq: Once | ORAL | Status: AC
  Administered 2022-10-25: 1 via ORAL
  Filled 2022-10-25: qty 1

## 2022-10-25 NOTE — ED Triage Notes (Addendum)
Pt reports LT sided swelling to the jaw. PT reports she needs to have dental work done. 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 Denies fevers at home, and states it hurts to eat or talk x2 weeks. Denies dental pain. Speaking full sentences.

## 2022-10-26 ENCOUNTER — Emergency Department (HOSPITAL_BASED_OUTPATIENT_CLINIC_OR_DEPARTMENT_OTHER): Payer: Self-pay

## 2022-10-26 LAB — BASIC METABOLIC PANEL
Anion gap: 7 (ref 5–15)
BUN: 13 mg/dL (ref 6–20)
CO2: 23 mmol/L (ref 22–32)
Calcium: 9.1 mg/dL (ref 8.9–10.3)
Chloride: 102 mmol/L (ref 98–111)
Creatinine, Ser: 0.75 mg/dL (ref 0.44–1.00)
GFR, Estimated: >60 mL/min (ref >60)
Glucose, Bld: 98 mg/dL (ref 70–99)
Potassium: 3.8 mmol/L (ref 3.5–5.1)
Sodium: 132 mmol/L — ABNORMAL LOW (ref 135–145)

## 2022-10-26 MED ORDER — VALACYCLOVIR HCL 1 G PO TABS: 1000.0000 mg | ORAL_TABLET | Freq: Three times a day (TID) | ORAL | 0 refills | Status: AC

## 2022-10-26 MED ORDER — NAPROXEN 500 MG PO TABS: 500.0000 mg | ORAL_TABLET | Freq: Two times a day (BID) | ORAL | 0 refills | Status: AC

## 2022-10-26 MED ORDER — IOHEXOL 350 MG/ML SOLN
75.0000 mL | Freq: Once | INTRAVENOUS | Status: AC | PRN
  Administered 2022-10-26: 75 mL via INTRAVENOUS

## 2022-10-26 MED ORDER — DOXYCYCLINE HYCLATE 100 MG PO CAPS
100.0000 mg | ORAL_CAPSULE | Freq: Two times a day (BID) | ORAL | 0 refills | Status: AC
Start: 2022-10-26 — End: 2022-11-02

## 2022-10-26 NOTE — Discharge Instructions (Signed)
You were evaluated in the Emergency Department and after careful evaluation, we did not find any emergent condition requiring admission or further testing in the hospital.  Your exam/testing today is overall reassuring.  Recommend continued follow-up with your neurosurgeons and regular doctors to discuss your symptoms.  Use the Naprosyn twice daily for pain.  Take the doxycycline and valacyclovir medications to clear any infection.  Please return to the Emergency Department if you experience any worsening of your condition.   Thank you for allowing [place] to be a part of your care.

## 2022-10-26 NOTE — ED Provider Notes (Signed)
[facility] Emergency Department Provider Note MRN:  [id number]  Arrival date & time: 10/26/22     Chief Complaint   Facial Swelling   History of Present Illness   Krystal Curry is a 42 y.o. year-old female with no pertinent past medical history presenting to the ED with chief complaint of facial swelling.  Pain and swelling to the left jaw, pain to the ear.  Complains that she has an AVM and has had the same pain for the past few weeks.  No fever.  Review of Systems  A thorough review of systems was obtained and all systems are negative except as noted in the HPI and PMH.   Patient's Health History    Past Medical History:  Diagnosis Date   Bone spur    "between my shoulder blades"   Shingles     Past Surgical History:  Procedure Laterality Date   CHOLECYSTECTOMY     KNEE SURGERY Left    MULTIPLE TOOTH EXTRACTIONS Bilateral     Family History  Family history unknown: Yes    Social History   Socioeconomic History   Marital status: Legally Separated    Spouse name: Not on file   Number of children: Not on file   Years of education: Not on file   Highest education level: Not on file  Occupational History   Not on file  Tobacco Use   Smoking status: Every Day    Packs/day: .15    Types: Cigarettes   Smokeless tobacco: Never  Vaping Use   Vaping Use: Never used  Substance and Sexual Activity   Alcohol use: Not Currently    Comment: socially   Drug use: No   Sexual activity: Not on file  Other Topics Concern   Not on file  Social History Narrative   ** Merged History Encounter **       Social Determinants of Health   Financial Resource Strain: Not on file  Food Insecurity: Not on file  Transportation Needs: Not on file  Physical Activity: Not on file  Stress: Not on file  Social Connections: Not on file  Intimate Partner Violence: Not on file     Physical Exam   Vitals:   10/26/22 0130 10/26/22 0145  BP: 103/62  102/72  Pulse: 71 77  Resp:    Temp:    SpO2: 99% 97%    CONSTITUTIONAL: Well-appearing, NAD NEURO/PSYCH:  Alert and oriented x 3, no focal deficits EYES:  eyes equal and reactive ENT/NECK:  no LAD, no JVD CARDIO: Regular rate, well-perfused, normal S1 and S2 PULM:  CTAB no wheezing or rhonchi GI/GU:  non-distended, non-tender MSK/SPINE:  No gross deformities, no edema SKIN:  no rash, atraumatic   *Additional and/or pertinent findings included in MDM below  Diagnostic and Interventional Summary    EKG Interpretation  Date/Time:    Ventricular Rate:    PR Interval:    QRS Duration:   QT Interval:    QTC Calculation:   R Axis:     Text Interpretation:         Labs Reviewed  CBC - Abnormal; Notable for the following components:      Result Value   WBC 11.6 (*)    All other components within normal limits  BASIC METABOLIC PANEL - Abnormal; Notable for the following components:   Sodium 132 (*)    All other components within normal limits    CT ANGIO HEAD NECK W  WO CM    (Results Pending)    Medications  HYDROcodone-acetaminophen (NORCO/VICODIN) 5-325 MG per tablet 1 tablet (1 tablet Oral Given 10/25/22 2326)  iohexol (OMNIPAQUE) 350 MG/ML injection 75 mL (75 mLs Intravenous Contrast Given 10/26/22 0020)     Procedures  /  Critical Care Procedures  ED Course and Medical Decision Making  Initial Impression and Ddx Patient has tenderness to the left jawline, very poor dentition with diffuse decay.  Suspect odontogenic infection as the source of this pain.  She also has preauricular pain, pain with chewing.  Interesting note from neurosurgery.  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  Unclear if this is causing the ear pain, suspect it is not.  Given the worsening nature, will obtain CT imaging.  No signs of otitis media.  Could be TMJ or referred pain from a tooth infection.  Past medical/surgical history that increases complexity of ED  encounter: AVM  Interpretation of Diagnostics I personally reviewed the laboratory assessment and my interpretation is as follows: No significant blood count or electrolyte disturbance  CTA is with unchanged appearance of the possible AVM, no other complicating features.  Patient Reassessment and Ultimate Disposition/Management     Patient feeling better, continued reassuring vital signs, neuroexam, appropriate for discharge.  Patient management required discussion with the following services or consulting groups:  None  Complexity of Problems Addressed Acute illness or injury that poses threat of life of bodily function  Additional Data Reviewed and Analyzed Further history obtained from: Recent Consult notes and Care Everywhere  Additional Factors Impacting ED Encounter Risk Prescriptions  Elmer Sow. Pilar Plate, MD [facility] Emergency Medicine [facility] [email or web address]  Final Clinical Impressions(s) / ED Diagnoses     ICD-10-CM   1. Facial pain  R51.9       ED Discharge Orders          Ordered    doxycycline (VIBRAMYCIN) 100 MG capsule  2 times daily        10/26/22 0158    valACYclovir (VALTREX) 1000 MG tablet  3 times daily        10/26/22 0158    naproxen (NAPROSYN) 500 MG tablet  2 times daily        10/26/22 0158             Discharge Instructions Discussed with and Provided to Patient:    Discharge Instructions      You were evaluated in the Emergency Department and after careful evaluation, we did not find any emergent condition requiring admission or further testing in the hospital.  Your exam/testing today is overall reassuring.  Recommend continued follow-up with your neurosurgeons and regular doctors to discuss your symptoms.  Use the Naprosyn twice daily for pain.  Take the doxycycline and valacyclovir medications to clear any infection.  Please return to the Emergency Department if you experience any worsening of your  condition.   Thank you for allowing [place] to be a part of your care.      Sabas Sous, MD 10/26/22 [phone]

## 2023-06-01 ENCOUNTER — Other Ambulatory Visit: Payer: Self-pay | Admitting: Orthopedic Surgery

## 2023-06-01 DIAGNOSIS — M25552 Pain in left hip: Secondary | ICD-10-CM

## 2023-06-01 DIAGNOSIS — M545 Low back pain, unspecified: Secondary | ICD-10-CM

## 2023-06-23 ENCOUNTER — Ambulatory Visit
Admission: RE | Admit: 2023-06-23 | Discharge: 2023-06-23 | Disposition: A | Discharge status: Home / Self Care | Payer: Medicaid Other | Source: Ambulatory Visit | Attending: Orthopedic Surgery | Admitting: Orthopedic Surgery

## 2023-06-23 DIAGNOSIS — M545 Low back pain, unspecified: Secondary | ICD-10-CM

## 2023-06-23 DIAGNOSIS — M25552 Pain in left hip: Secondary | ICD-10-CM

## 2023-06-29 DIAGNOSIS — H109 Unspecified conjunctivitis: Secondary | ICD-10-CM | POA: Insufficient documentation

## 2023-06-29 DIAGNOSIS — J209 Acute bronchitis, unspecified: Secondary | ICD-10-CM | POA: Insufficient documentation

## 2023-10-20 ENCOUNTER — Ambulatory Visit
Admission: EM | Admit: 2023-10-20 | Discharge: 2023-10-20 | Disposition: A | Discharge status: Home / Self Care | Attending: Family Medicine | Admitting: Family Medicine

## 2023-10-20 DIAGNOSIS — R0989 Other specified symptoms and signs involving the circulatory and respiratory systems: Secondary | ICD-10-CM

## 2023-10-20 LAB — POC COVID19/FLU A&B COMBO
Covid Antigen, POC: NEGATIVE
Influenza A Antigen, POC: NEGATIVE
Influenza B Antigen, POC: NEGATIVE

## 2023-10-20 MED ORDER — AZELASTINE HCL 0.1 % NA SOLN: 2.0000 | Freq: Two times a day (BID) | NASAL | 12 refills | Status: AC

## 2023-10-20 MED ORDER — BENZONATATE 200 MG PO CAPS: 200.0000 mg | ORAL_CAPSULE | Freq: Three times a day (TID) | ORAL | 0 refills | Status: AC | PRN

## 2023-10-20 MED ORDER — PSEUDOEPHEDRINE HCL 30 MG PO TABS: 30.0000 mg | ORAL_TABLET | ORAL | 0 refills | Status: AC | PRN

## 2023-10-20 NOTE — ED Triage Notes (Signed)
"  This started with nasal congestion, runny nose x2 days ago, but gotten much worse now with a Cough (productive) as well". No sob. No wheezing.

## 2023-10-20 NOTE — Discharge Instructions (Addendum)
 You were seen today for upper respiratory symptoms.  Your flu/covid test was negative.  This is likely due to allergies/another viral illness.  You are already on an oral antibiotic that would take care of any bacterial infection.  I have sent out a medication to help with cough.  I have sent out a nasal spray and sudafed as well to help with congestion and drainage.  None of these medications have a steroid, which you are allergic to.  Please get plenty of rest and fluids.  Return if not improving.

## 2023-10-20 NOTE — ED Provider Notes (Addendum)
 [facility]    CSN: [id number] Arrival date & time: 10/20/23  1325      History   Chief Complaint Chief Complaint  Patient presents with   Cough   Nasal Congestion    HPI Krystal Curry is a 43 y.o. female.    Cough Associated symptoms: chills and rhinorrhea   Associated symptoms: no fever, no shortness of breath and no wheezing    Patient is here for 2 days of URI symptoms.  Having sinus congestion, drainage.  Thought was allergies.  This morning she woke up with fatigue, and cough.  No sob or wheezing.  Some chills, some sweats.  She is not taking any otc meds.  Allergy medication makes her "sleepy all day".  She is on clindamycin for teeth infection.  No known exposures.  She is allergic to prednisone.           Past Medical History:  Diagnosis Date   Bone spur    "between my shoulder blades"   Shingles     There are no active problems to display for this patient.   Past Surgical History:  Procedure Laterality Date   CHOLECYSTECTOMY     KNEE SURGERY Left    MULTIPLE TOOTH EXTRACTIONS Bilateral     OB History   No obstetric history on file.      Home Medications    Prior to Admission medications   Medication Sig Start Date End Date Taking? Authorizing Provider  acetaminophen (TYLENOL) 325 MG tablet Take 325 mg by mouth every 6 (six) hours as needed. 07/18/19  Yes [provider]  acyclovir (ZOVIRAX) 800 MG tablet Take 800 mg by mouth as directed. 09/28/20  Yes [provider]  albuterol (VENTOLIN HFA) 108 (90 Base) MCG/ACT inhaler Inhale 2 puffs into the lungs every 4 (four) hours as needed for wheezing or shortness of breath. 10/04/23  Yes [provider]  azithromycin (ZITHROMAX) 250 MG tablet Take 250 mg by mouth as directed. 06/29/23  Yes [provider]  chlorhexidine (PERIDEX) 0.12 % solution Use as directed 15 mLs in the mouth or throat 3 (three) times daily. 05/19/23  Yes [provider]  diazepam (VALIUM) 5 MG tablet Take 5 mg by mouth daily as needed. 10/18/23  Yes [provider]  gabapentin (NEURONTIN) 600 MG tablet Take 600 mg by mouth 3 (three) times daily.   Yes [provider]  ibuprofen (ADVIL) 600 MG tablet Take 600 mg by mouth every 8 (eight) hours as needed. 10/05/19  Yes [provider]  promethazine-dextromethorphan (PROMETHAZINE-DM) 6.25-15 MG/5ML syrup Take 5 mLs by mouth every 4 (four) hours as needed. 06/29/23  Yes [provider]  trimethoprim-polymyxin b (POLYTRIM) ophthalmic solution Place 1 drop into both eyes every 6 (six) hours. 06/29/23  Yes [provider]  aspirin-acetaminophen-caffeine (EXCEDRIN MIGRAINE) [phone] MG tablet Take 2 tablets by mouth as needed.    [provider]  clindamycin (CLEOCIN) 150 MG capsule Take 2 capsules (300 mg total) by mouth 3 (three) times daily. 03/10/18   Fisher, Roselyn Bering, PA-C  famotidine (PEPCID) 40 MG tablet Take 1 tablet (40 mg total) by mouth at bedtime. 10/08/17 10/08/18  Schaevitz, Myra Rude, MD  naproxen (NAPROSYN) 500 MG tablet Take 1 tablet (500 mg total) by mouth 2 (two) times daily. 10/26/22   Sabas Sous, MD  omeprazole (PRILOSEC) 20 MG capsule Take 20 mg by mouth daily.    [provider]  ondansetron (ZOFRAN ODT)  4 MG disintegrating tablet 4mg  ODT q4 hours prn nausea/vomit 09/22/20   Bethann Berkshire, MD  oxyCODONE-acetaminophen (PERCOCET/ROXICET) 5-325 MG tablet Take 1 tablet by mouth every 6 (six) hours as needed for severe pain. 09/28/20   Long, Arlyss Repress, MD  senna-docusate (SENOKOT-S) 8.6-50 MG tablet Take 1 tablet by mouth at bedtime as needed for mild constipation or moderate constipation. 09/28/20   Long, Arlyss Repress, MD    Family History Family History  Family history unknown: Yes    Social History Social History   Tobacco Use   Smoking status: Every Day    Current packs/day: 0.15    Types: Cigarettes   Smokeless  tobacco: Never  Vaping Use   Vaping status: Never Used  Substance Use Topics   Alcohol use: Yes    Comment: socially   Drug use: Never     Allergies   Codeine, Penicillins, Penicillins, Tizanidine, Prednisone, and Tylenol [acetaminophen]   Review of Systems Review of Systems  Constitutional:  Positive for chills and fatigue. Negative for fever.  HENT:  Positive for congestion and rhinorrhea.   Respiratory:  Positive for cough. Negative for shortness of breath and wheezing.   Gastrointestinal: Negative.   Genitourinary: Negative.   Musculoskeletal: Negative.   Psychiatric/Behavioral: Negative.       Physical Exam Triage Vital Signs ED Triage Vitals  Encounter Vitals Group     BP 10/20/23 1347 125/81     Systolic BP Percentile --      Diastolic BP Percentile --      Pulse Rate 10/20/23 1347 (!) 113     Resp 10/20/23 1347 (!) 22     Temp 10/20/23 1347 98.2 F (36.8 C)     Temp Source 10/20/23 1347 Oral     SpO2 10/20/23 1347 94 %     Weight 10/20/23 1343 151 lb 0.2 oz (68.5 kg)     Height 10/20/23 1343 5\' 1"  (1.549 m)     Head Circumference --      Peak Flow --      Pain Score 10/20/23 1341 0     Pain Loc --      Pain Education --      Exclude from Growth Chart --    No data found.  Updated Vital Signs BP 125/81 (BP Location: Left Arm)   Pulse (!) 105   Temp 98.2 F (36.8 C) (Oral)   Resp 20   Ht 5\' 1"  (1.549 m)   Wt 68.5 kg   LMP 09/08/2023 (Exact Date)   SpO2 94%   BMI 28.53 kg/m   Visual Acuity Right Eye Distance:   Left Eye Distance:   Bilateral Distance:    Right Eye Near:   Left Eye Near:    Bilateral Near:     Physical Exam Constitutional:      General: She is not in acute distress.    Appearance: Normal appearance. She is normal weight. She is not toxic-appearing.  HENT:     Nose: Congestion and rhinorrhea present.     Right Sinus: Maxillary sinus tenderness present.     Left Sinus: Maxillary sinus tenderness present.      Mouth/Throat:     Mouth: Mucous membranes are moist.  Cardiovascular:     Rate and Rhythm: Normal rate and regular rhythm.  Pulmonary:     Effort: Pulmonary effort is normal.     Breath sounds: Normal breath sounds.  Musculoskeletal:     Cervical back: Normal range of motion  and neck supple. Tenderness present.  Lymphadenopathy:     Cervical: No cervical adenopathy.  Skin:    General: Skin is warm.  Neurological:     General: No focal deficit present.     Mental Status: She is alert.  Psychiatric:        Mood and Affect: Mood normal.      UC Treatments / Results  Labs (all labs ordered are listed, but only abnormal results are displayed) Labs Reviewed  POC COVID19/FLU A&B COMBO - Normal    EKG   Radiology No results found.  Procedures Procedures (including critical care time)  Medications Ordered in UC Medications - No data to display  Initial Impression / Assessment and Plan / UC Course  I have reviewed the triage vital signs and the nursing notes.  Pertinent labs & imaging results that were available during my care of the patient were reviewed by me and considered in my medical decision making (see chart for details).   Final Clinical Impressions(s) / UC Diagnoses   Final diagnoses:  Symptoms of upper respiratory infection (URI)     Discharge Instructions      You were seen today for upper respiratory symptoms.  Your flu/covid test was negative.  This is likely due to allergies/another viral illness.  You are already on an oral antibiotic that would take care of any bacterial infection.  I have sent out a medication to help with cough.  I have sent out a nasal spray and sudafed as well to help with congestion and drainage.  None of these medications have a steroid, which you are allergic to.  Please get plenty of rest and fluids.  Return if not improving.     ED Prescriptions     Medication Sig Dispense Auth. Provider   benzonatate (TESSALON) 200  MG capsule Take 1 capsule (200 mg total) by mouth 3 (three) times daily as needed for cough. 21 capsule Gusta Marksberry, MD   azelastine (ASTELIN) 0.1 % nasal spray Place 2 sprays into both nostrils 2 (two) times daily. Use in each nostril as directed 30 mL Johann Santone, MD   pseudoephedrine (SUDAFED) 30 MG tablet Take 1 tablet (30 mg total) by mouth every 4 (four) hours as needed for congestion. 30 tablet Jannifer Franklin, MD      PDMP not reviewed this encounter.   Jannifer Franklin, MD 10/20/23 1426    Jannifer Franklin, MD 10/20/23 1433

## 2023-10-25 DIAGNOSIS — R21 Rash and other nonspecific skin eruption: Secondary | ICD-10-CM | POA: Insufficient documentation

## 2024-01-11 ENCOUNTER — Other Ambulatory Visit: Payer: Self-pay | Admitting: Orthopedic Surgery

## 2024-01-11 DIAGNOSIS — M25562 Pain in left knee: Secondary | ICD-10-CM

## 2024-02-04 ENCOUNTER — Ambulatory Visit
Admission: RE | Admit: 2024-02-04 | Discharge: 2024-02-04 | Disposition: A | Discharge status: Home / Self Care | Source: Ambulatory Visit | Attending: Orthopedic Surgery | Admitting: Orthopedic Surgery

## 2024-02-04 DIAGNOSIS — M25562 Pain in left knee: Secondary | ICD-10-CM

## 2024-02-07 DIAGNOSIS — N926 Irregular menstruation, unspecified: Secondary | ICD-10-CM | POA: Insufficient documentation

## 2024-02-07 DIAGNOSIS — N390 Urinary tract infection, site not specified: Secondary | ICD-10-CM | POA: Insufficient documentation

## 2024-02-29 DIAGNOSIS — N3289 Other specified disorders of bladder: Secondary | ICD-10-CM | POA: Insufficient documentation

## 2024-03-29 DIAGNOSIS — M25579 Pain in unspecified ankle and joints of unspecified foot: Secondary | ICD-10-CM | POA: Insufficient documentation

## 2024-04-26 ENCOUNTER — Other Ambulatory Visit: Payer: Self-pay | Admitting: Family Medicine

## 2024-04-26 DIAGNOSIS — F17210 Nicotine dependence, cigarettes, uncomplicated: Secondary | ICD-10-CM | POA: Insufficient documentation

## 2024-04-26 DIAGNOSIS — N951 Menopausal and female climacteric states: Secondary | ICD-10-CM | POA: Insufficient documentation

## 2024-04-26 DIAGNOSIS — M25572 Pain in left ankle and joints of left foot: Secondary | ICD-10-CM

## 2024-05-02 DIAGNOSIS — S9002XA Contusion of left ankle, initial encounter: Secondary | ICD-10-CM | POA: Insufficient documentation

## 2024-05-05 NOTE — Progress Notes (Unsigned)
 Office Visit Note  Patient: Krystal Curry             Date of Birth: 04/14/1981           MRN: [id number]             PCP: Donnie Handing, PA Referring: Loretha Tia Caldron* Visit Date: 05/06/2024 Occupation: Data Unavailable  Subjective:  No chief complaint on file.   History of Present Illness: Krystal Curry is a 43 y.o. female ***     Activities of Daily Living:  Patient reports morning stiffness for *** {minute/hour:19697}.   Patient {ACTIONS;DENIES/REPORTS:21021675::Denies} nocturnal pain.  Difficulty dressing/grooming: {ACTIONS;DENIES/REPORTS:21021675::Denies} Difficulty climbing stairs: {ACTIONS;DENIES/REPORTS:21021675::Denies} Difficulty getting out of chair: {ACTIONS;DENIES/REPORTS:21021675::Denies} Difficulty using hands for taps, buttons, cutlery, and/or writing: {ACTIONS;DENIES/REPORTS:21021675::Denies}  No Rheumatology ROS completed.   PMFS History:  There are no active problems to display for this patient.   Past Medical History:  Diagnosis Date   Bone spur    between my shoulder blades   Shingles     Family History  Family history unknown: Yes   Past Surgical History:  Procedure Laterality Date   CHOLECYSTECTOMY     KNEE SURGERY Left    MULTIPLE TOOTH EXTRACTIONS Bilateral    Social History   Tobacco Use   Smoking status: Every Day    Current packs/day: 0.15    Types: Cigarettes   Smokeless tobacco: Never  Vaping Use   Vaping status: Never Used  Substance Use Topics   Alcohol use: Yes    Comment: socially   Drug use: Never   Social History   Social History Narrative   ** Merged History Encounter **         Immunization History  Administered Date(s) Administered   Tdap 03/10/2018     Objective: Vital Signs: There were no vitals taken for this visit.   Physical Exam   Musculoskeletal Exam: ***  CDAI Exam: CDAI Score: -- Patient Global: --; Provider Global: -- Swollen: --; Tender: -- Joint Exam  05/06/2024   No joint exam has been documented for this visit   There is currently no information documented on the homunculus. Go to the Rheumatology activity and complete the homunculus joint exam.  Investigation: No additional findings.  Imaging: No results found.  Recent Labs: Lab Results  Component Value Date   WBC 11.6 (H) 10/25/2022   HGB 13.4 10/25/2022   PLT 325 10/25/2022   NA 132 (L) 10/25/2022   K 3.8 10/25/2022   CL 102 10/25/2022   CO2 23 10/25/2022   GLUCOSE 98 10/25/2022   BUN 13 10/25/2022   CREATININE 0.75 10/25/2022   BILITOT 0.3 09/28/2020   ALKPHOS 52 09/28/2020   AST 58 (H) 09/28/2020   ALT 66 (H) 09/28/2020   PROT 8.3 (H) 09/28/2020   ALBUMIN 4.4 09/28/2020   CALCIUM 9.1 10/25/2022   GFRAA >60 01/09/2020    Speciality Comments: No specialty comments available.  Procedures:  No procedures performed Allergies: Codeine, Penicillins, Penicillins, Tizanidine, Prednisone, and Tylenol  [acetaminophen ]   Assessment / Plan:     Visit Diagnoses: No diagnosis found.  Orders: No orders of the defined types were placed in this encounter.  No orders of the defined types were placed in this encounter.   Face-to-face time spent with patient was *** minutes. Greater than 50% of time was spent in counseling and coordination of care.  Follow-Up Instructions: No follow-ups on file.   Krystal LELON Ester, MD  Note - This record has  been created using [employer].  Chart creation errors have been sought, but may not always  have been located. Such creation errors do not reflect on  the standard of medical care.

## 2024-05-06 ENCOUNTER — Encounter: Payer: Self-pay | Admitting: Internal Medicine

## 2024-05-06 ENCOUNTER — Ambulatory Visit: Attending: Internal Medicine | Admitting: Internal Medicine

## 2024-05-06 VITALS — BP 138/82 | HR 79 | Temp 98.4°F | Resp 15 | Ht 60.0 in | Wt 142.0 lb

## 2024-05-06 DIAGNOSIS — R21 Rash and other nonspecific skin eruption: Secondary | ICD-10-CM | POA: Diagnosis present

## 2024-05-06 DIAGNOSIS — N951 Menopausal and female climacteric states: Secondary | ICD-10-CM | POA: Diagnosis present

## 2024-05-06 DIAGNOSIS — R7689 Other specified abnormal immunological findings in serum: Secondary | ICD-10-CM | POA: Diagnosis present

## 2024-05-06 DIAGNOSIS — M7918 Myalgia, other site: Secondary | ICD-10-CM | POA: Insufficient documentation

## 2024-05-06 NOTE — Patient Instructions (Signed)
 I recommend checking out the [place] [facility] [place] patient-centered guide for fibromyalgia and chronic pain management: [email or web address]

## 2024-05-08 LAB — ANA,IFA RA DIAG PNL W/RFLX TIT/PATN
Anti Nuclear Antibody (ANA): POSITIVE — AB
Cyclic Citrullin Peptide Ab: <16 U
Rheumatoid fact SerPl-aCnc: <10 [IU]/mL (ref <14)

## 2024-05-08 LAB — ANTI-NUCLEAR AB-TITER (ANA TITER): ANA Titer 1: 1:80 {titer} — ABNORMAL HIGH

## 2024-05-08 LAB — SEDIMENTATION RATE: Sed Rate: 6 mm/h (ref 0–20)

## 2024-05-15 ENCOUNTER — Ambulatory Visit
Admission: RE | Admit: 2024-05-15 | Discharge: 2024-05-15 | Disposition: A | Discharge status: Home / Self Care | Source: Ambulatory Visit | Attending: Family Medicine | Admitting: Family Medicine

## 2024-05-15 ENCOUNTER — Ambulatory Visit
Admission: RE | Admit: 2024-05-15 | Discharge: 2024-05-15 | Disposition: A | Source: Ambulatory Visit | Attending: Family Medicine | Admitting: Family Medicine

## 2024-05-15 DIAGNOSIS — M25572 Pain in left ankle and joints of left foot: Secondary | ICD-10-CM

## 2024-08-26 ENCOUNTER — Ambulatory Visit: Admitting: Internal Medicine
